# Patient Record
Sex: Female | Born: 1940 | Race: Black or African American | Hispanic: No | Marital: Single | Smoking: Former smoker
Health system: Southern US, Community
[De-identification: ages and names within clinical notes are randomized; demographics above are authoritative.]

## PROBLEM LIST (undated history)

## (undated) ENCOUNTER — Emergency Department (HOSPITAL_COMMUNITY): Payer: Medicare Other

## (undated) DIAGNOSIS — M1990 Unspecified osteoarthritis, unspecified site: Secondary | ICD-10-CM

## (undated) DIAGNOSIS — M549 Dorsalgia, unspecified: Secondary | ICD-10-CM

## (undated) DIAGNOSIS — I10 Essential (primary) hypertension: Secondary | ICD-10-CM

## (undated) DIAGNOSIS — K219 Gastro-esophageal reflux disease without esophagitis: Secondary | ICD-10-CM

## (undated) DIAGNOSIS — M25569 Pain in unspecified knee: Secondary | ICD-10-CM

## (undated) DIAGNOSIS — F32A Depression, unspecified: Secondary | ICD-10-CM

## (undated) DIAGNOSIS — F329 Major depressive disorder, single episode, unspecified: Secondary | ICD-10-CM

## (undated) HISTORY — DX: Major depressive disorder, single episode, unspecified: F32.9

## (undated) HISTORY — DX: Pain in unspecified knee: M25.569

## (undated) HISTORY — DX: Depression, unspecified: F32.A

## (undated) HISTORY — DX: Dorsalgia, unspecified: M54.9

## (undated) HISTORY — DX: Gastro-esophageal reflux disease without esophagitis: K21.9

---

## 2010-01-19 ENCOUNTER — Encounter: Admission: RE | Admit: 2010-01-19 | Discharge: 2010-01-19 | Payer: Self-pay | Admitting: Family Medicine

## 2012-02-25 ENCOUNTER — Emergency Department (HOSPITAL_COMMUNITY): Payer: Medicare Other

## 2012-02-25 ENCOUNTER — Emergency Department (HOSPITAL_COMMUNITY)
Admission: EM | Admit: 2012-02-25 | Discharge: 2012-02-25 | Disposition: A | Discharge status: Home Health | Payer: Medicare Other | Attending: Emergency Medicine | Admitting: Emergency Medicine

## 2012-02-25 ENCOUNTER — Encounter (HOSPITAL_COMMUNITY): Payer: Self-pay | Admitting: *Deleted

## 2012-02-25 DIAGNOSIS — E119 Type 2 diabetes mellitus without complications: Secondary | ICD-10-CM | POA: Insufficient documentation

## 2012-02-25 DIAGNOSIS — S93609A Unspecified sprain of unspecified foot, initial encounter: Secondary | ICD-10-CM | POA: Insufficient documentation

## 2012-02-25 DIAGNOSIS — S9030XA Contusion of unspecified foot, initial encounter: Secondary | ICD-10-CM | POA: Insufficient documentation

## 2012-02-25 DIAGNOSIS — M79609 Pain in unspecified limb: Secondary | ICD-10-CM | POA: Insufficient documentation

## 2012-02-25 DIAGNOSIS — W108XXA Fall (on) (from) other stairs and steps, initial encounter: Secondary | ICD-10-CM | POA: Insufficient documentation

## 2012-02-25 DIAGNOSIS — I10 Essential (primary) hypertension: Secondary | ICD-10-CM | POA: Insufficient documentation

## 2012-02-25 HISTORY — DX: Essential (primary) hypertension: I10

## 2012-02-25 HISTORY — DX: Unspecified osteoarthritis, unspecified site: M19.90

## 2012-02-25 MED ORDER — HYDROCODONE-ACETAMINOPHEN 5-325 MG PO TABS: 1.0000 | ORAL_TABLET | Freq: Four times a day (QID) | ORAL | Status: AC | PRN

## 2012-02-25 NOTE — ED Provider Notes (Signed)
History     CSN: [id number]  Arrival date & time 02/25/12  1936   First MD Initiated Contact with Patient 02/25/12 2039      Chief Complaint  Patient presents with  . Foot Pain    (Consider location/radiation/quality/duration/timing/severity/associated sxs/prior treatment) HPI Patient presents the emergency department after falling down 2 stairs, landing with her foot, behind her.  Patient complains of foot pain around the great toe.  Patient states that her ankle is nontender at this time.  Patient denies weakness or numbness in her foot.  States she can move her toes, but there is pain with movement of the great toe.  Patient denies chest pain, syncope, dizziness, prior to the fall.  She states, that she tripped on a stair. Past Medical History  Diagnosis Date  . Hypertension   . Diabetes mellitus   . Arthritis     History reviewed. No pertinent past surgical history.  No family history on file.  History  Substance Use Topics  . Smoking status: Never Smoker   . Smokeless tobacco: Not on file  . Alcohol Use: Yes    OB History    Grav Para Term Preterm Abortions TAB SAB Ect Mult Living                  Review of Systems All other systems negative except as documented in the HPI. All pertinent positives and negatives as reviewed in the HPI.  Allergies  Review of patient's allergies indicates not on file.  Home Medications  No current outpatient prescriptions on file.  BP 156/66  Pulse 74  Temp(Src) 98.4 F (36.9 C) (Oral)  Resp 22  Ht 5\' 5"  (1.651 m)  Wt 210 lb (95.255 kg)  BMI 34.95 kg/m2  SpO2 96%  Physical Exam Physical Examination: General appearance - alert, well appearing, and in no distress, oriented to person, place, and time and overweight Chest - clear to auscultation, no wheezes, rales or rhonchi, symmetric air entry Heart - normal rate, regular rhythm, normal S1, S2, no murmurs, rubs, clicks or gallops Musculoskeletal - patient has bruising  and tenderness at the base of the right great toe with swelling noted in the area.  Palpation of ankle does not elicit any pain, and has full range of motion of the ankle.  Her some mild swelling noted to the lateral ankle, but no palpable pain in that area, the Achilles tendon is grossly intact.  Patient has normal sensation and cap refill is less than 2 seconds  ED Course  Procedures (including critical care time)  Patient will be placed in a postop shoe and referred to orthopedics for followup.  She is told to ice and elevate the foot and toe.  Return here for any worsening in her condition.  Patient was given the results.  Her x-rays at this point there is no fracture seen, but she could not fracture that is not detected on x-ray.  Based on the fact she has some chronic spurring and degeneration at the area where she is hurting.   MDM  MDM Reviewed: nursing note and vitals Interpretation: x-ray            Carlyle Dolly, PA-C 02/25/12 2232

## 2012-02-25 NOTE — Discharge Instructions (Signed)
Follow up with the orthopedist for a recheck. Ice and elevate your foot. Return here as needed.

## 2012-02-25 NOTE — ED Notes (Signed)
Pt states she fell down her step today and twisted her ankle. Pt states she is having right foot pain. Pt states pain increases when she bears weight on foot. No other c/o

## 2012-02-25 NOTE — ED Provider Notes (Signed)
Medical screening examination/treatment/procedure(s) were conducted as a shared visit with non-physician practitioner(s) and myself.  I personally evaluated the patient during the encounter On my exam the patient was in no distress.  Given her pain in the toe and given the equivocal x-ray findings, with her clinical history she was placed in a postoperative shoe and will followup with orthopedics  Gerhard Munch, MD 02/25/12 2332

## 2012-06-25 ENCOUNTER — Encounter (INDEPENDENT_AMBULATORY_CARE_PROVIDER_SITE_OTHER): Payer: Self-pay

## 2013-05-18 IMAGING — CR DG FOOT COMPLETE 3+V*R*
3 series · 3 of 3 positions shown · non-contrast
Comparison: None.

CLINICAL DATA: Fall.  Twisting foot injury.  Medial pain.

RIGHT FOOT COMPLETE - 3+ VIEW

[x foot ap right]
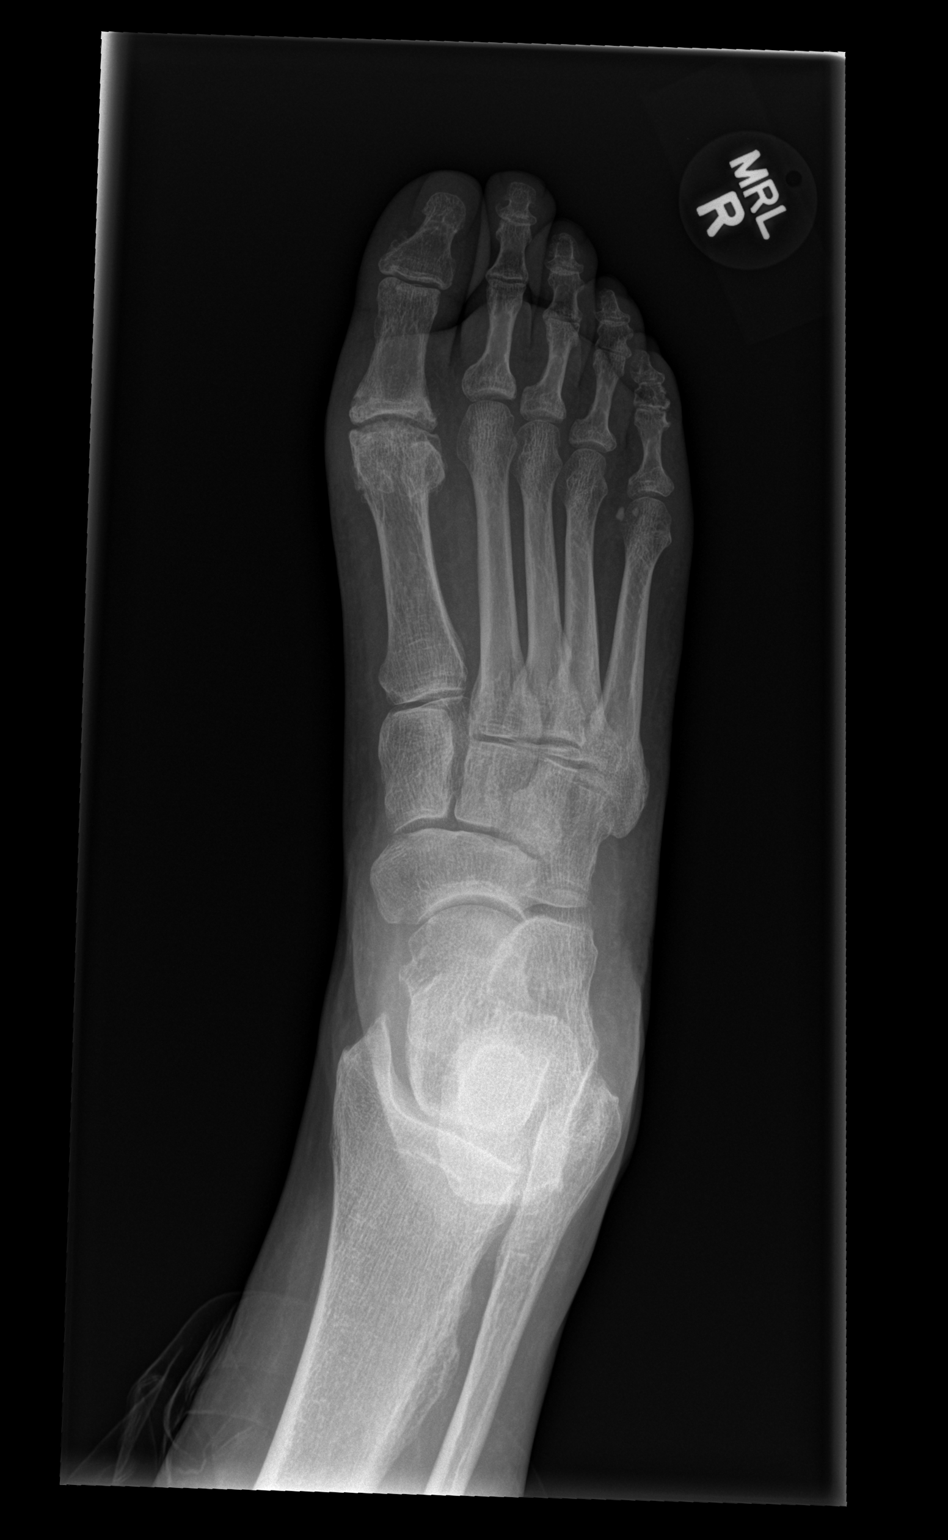

[x foot obl right]
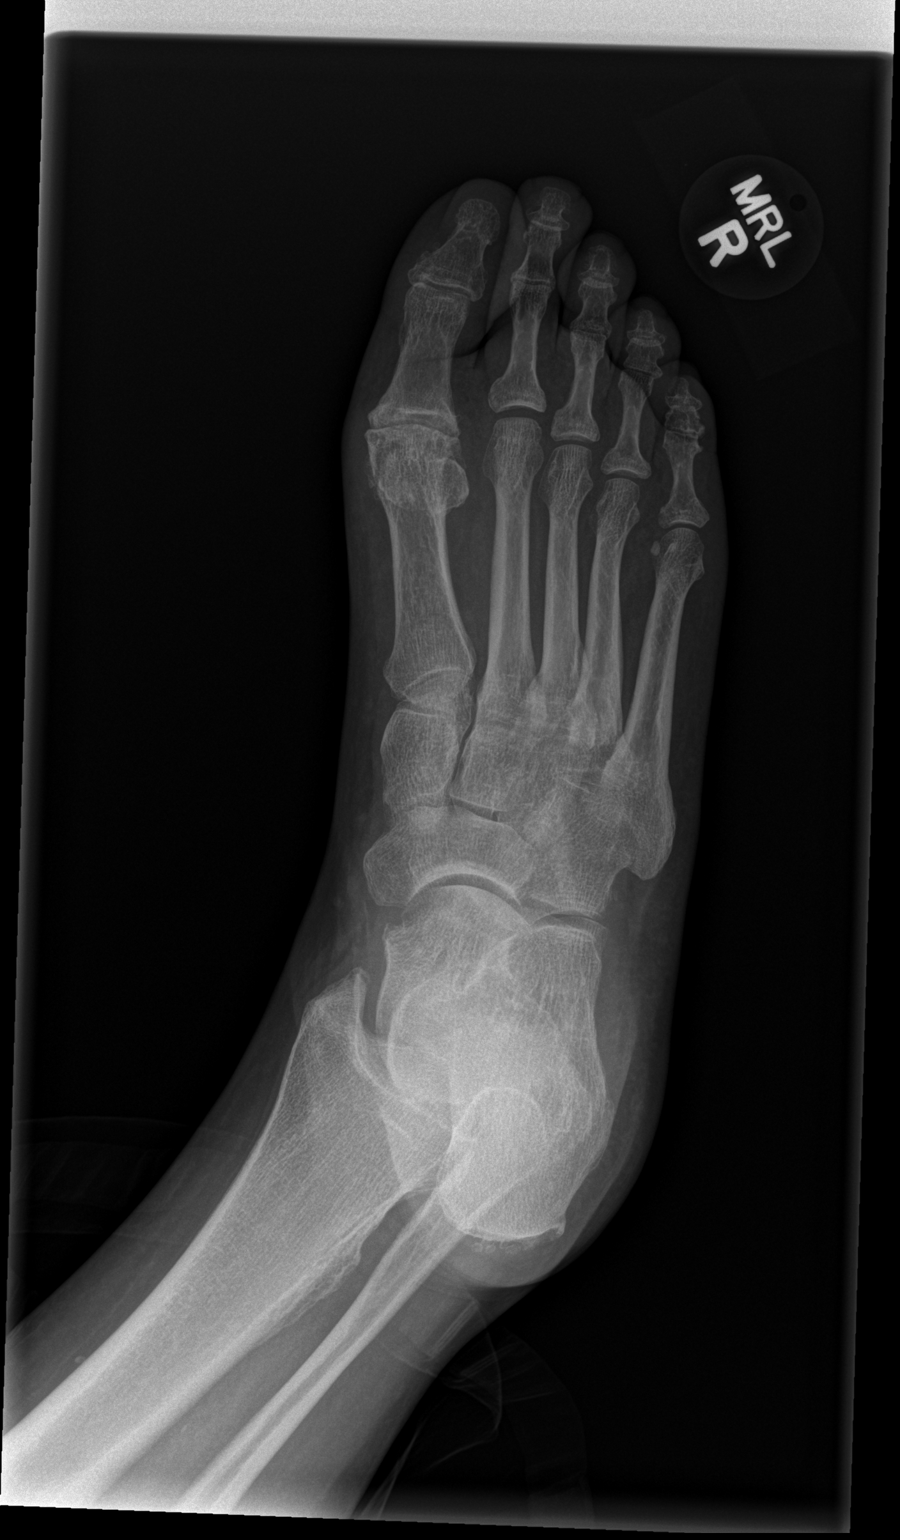

[x foot lat right]
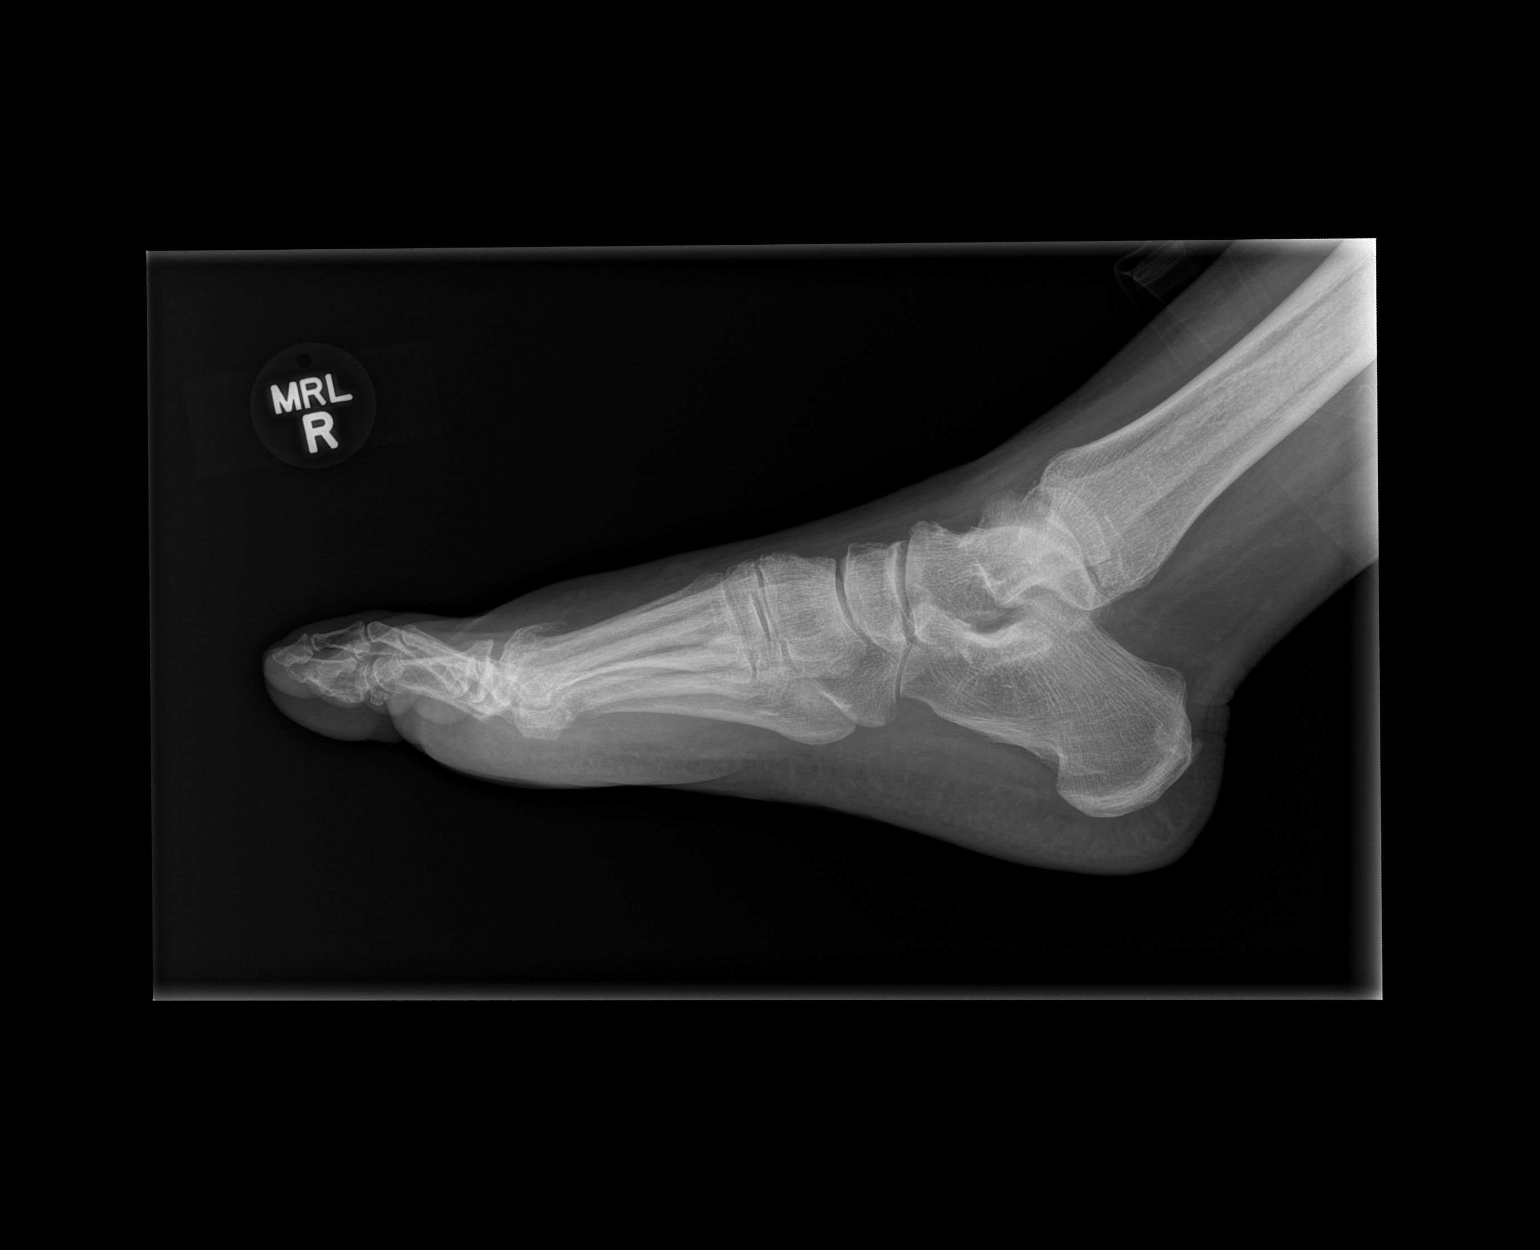

[3 of 3 positions shown; findings below may reference images not displayed]

FINDINGS: Prominent irregular spurring at the first
metatarsophalangeal joint noted.  Bony irregularity in this
vicinity favors spurring, although a fractured spur is difficult to
confidently exclude given that this is the location of the
patient's pain.  A well-defined fracture is not observed.

Alignment at the Lisfranc joint appears normal.  Medial sesamoid is
mildly indistinct due to the spurring which projects in this
vicinity.

There is a suggestion of mild callus formation in the proximal
metaphysis of the third metatarsal, possibly from old injury.

Achilles calcaneal spur noted.  There is mild dorsal midfoot
spurring.
IMPRESSION: 1.  Prominent spurring of the first metatarsal head and base of the
proximal phalanx of the great toe.  The degree of spurring makes it
difficult to exclude the possibility of underlying subtle fracture.
If symptoms persist despite conservative therapy, MRI followup may
be warranted.
2.  Mild cortical thickening laterally in the proximal metaphysis
of the third metatarsal, potentially from an old injury.
3.  Dorsal midfoot spurring.

## 2015-08-09 ENCOUNTER — Other Ambulatory Visit: Payer: Self-pay

## 2015-08-16 ENCOUNTER — Inpatient Hospital Stay: Payer: Self-pay | Admitting: Oncology

## 2015-12-28 ENCOUNTER — Other Ambulatory Visit: Payer: Self-pay | Admitting: Gastroenterology

## 2015-12-28 DIAGNOSIS — R1310 Dysphagia, unspecified: Secondary | ICD-10-CM

## 2016-03-28 ENCOUNTER — Ambulatory Visit: Payer: Self-pay | Admitting: Family Medicine

## 2018-06-04 ENCOUNTER — Encounter (INDEPENDENT_AMBULATORY_CARE_PROVIDER_SITE_OTHER): Payer: Self-pay

## 2018-06-10 ENCOUNTER — Encounter (INDEPENDENT_AMBULATORY_CARE_PROVIDER_SITE_OTHER): Payer: Self-pay | Admitting: Family Medicine

## 2018-06-10 ENCOUNTER — Ambulatory Visit (INDEPENDENT_AMBULATORY_CARE_PROVIDER_SITE_OTHER): Payer: Medicare Other | Admitting: Family Medicine

## 2018-06-10 VITALS — BP 150/80 | HR 59 | Temp 98.0°F | Ht 65.0 in | Wt 209.0 lb

## 2018-06-10 DIAGNOSIS — I10 Essential (primary) hypertension: Secondary | ICD-10-CM | POA: Diagnosis not present

## 2018-06-10 DIAGNOSIS — R0602 Shortness of breath: Secondary | ICD-10-CM

## 2018-06-10 DIAGNOSIS — Z6834 Body mass index (BMI) 34.0-34.9, adult: Secondary | ICD-10-CM | POA: Diagnosis not present

## 2018-06-10 DIAGNOSIS — E669 Obesity, unspecified: Secondary | ICD-10-CM

## 2018-06-10 DIAGNOSIS — E038 Other specified hypothyroidism: Secondary | ICD-10-CM | POA: Diagnosis not present

## 2018-06-10 DIAGNOSIS — Z0289 Encounter for other administrative examinations: Secondary | ICD-10-CM

## 2018-06-10 DIAGNOSIS — R5383 Other fatigue: Secondary | ICD-10-CM | POA: Diagnosis not present

## 2018-06-10 DIAGNOSIS — Z1331 Encounter for screening for depression: Secondary | ICD-10-CM | POA: Diagnosis not present

## 2018-06-10 DIAGNOSIS — E119 Type 2 diabetes mellitus without complications: Secondary | ICD-10-CM

## 2018-06-11 LAB — COMPREHENSIVE METABOLIC PANEL
ALBUMIN: 4.1 g/dL (ref 3.5–4.8)
ALK PHOS: 80 IU/L (ref 39–117)
ALT: 14 IU/L (ref 0–32)
AST: 14 IU/L (ref 0–40)
Albumin/Globulin Ratio: 1.7 (ref 1.2–2.2)
BILIRUBIN TOTAL: 0.3 mg/dL (ref 0.0–1.2)
BUN / CREAT RATIO: 15 (ref 12–28)
BUN: 13 mg/dL (ref 8–27)
CHLORIDE: 101 mmol/L (ref 96–106)
CO2: 25 mmol/L (ref 20–29)
Calcium: 9.2 mg/dL (ref 8.7–10.3)
Creatinine, Ser: 0.84 mg/dL (ref 0.57–1.00)
GFR calc non Af Amer: 68 mL/min/{1.73_m2} (ref >59)
GFR, EST AFRICAN AMERICAN: 79 mL/min/{1.73_m2} (ref >59)
GLOBULIN, TOTAL: 2.4 g/dL (ref 1.5–4.5)
Glucose: 92 mg/dL (ref 65–99)
Potassium: 4.4 mmol/L (ref 3.5–5.2)
Sodium: 141 mmol/L (ref 134–144)
Total Protein: 6.5 g/dL (ref 6.0–8.5)

## 2018-06-11 LAB — CBC WITH DIFFERENTIAL
Basophils Absolute: 0 10*3/uL (ref 0.0–0.2)
Basos: 0 %
EOS (ABSOLUTE): 0.1 10*3/uL (ref 0.0–0.4)
EOS: 3 %
HEMATOCRIT: 41.9 % (ref 34.0–46.6)
HEMOGLOBIN: 13.2 g/dL (ref 11.1–15.9)
Immature Grans (Abs): 0 10*3/uL (ref 0.0–0.1)
Immature Granulocytes: 0 %
Lymphocytes Absolute: 2.5 10*3/uL (ref 0.7–3.1)
Lymphs: 47 %
MCH: 30.9 pg (ref 26.6–33.0)
MCHC: 31.5 g/dL (ref 31.5–35.7)
MCV: 98 fL — AB (ref 79–97)
MONOS ABS: 0.4 10*3/uL (ref 0.1–0.9)
Monocytes: 8 %
Neutrophils Absolute: 2.2 10*3/uL (ref 1.4–7.0)
Neutrophils: 42 %
RBC: 4.27 x10E6/uL (ref 3.77–5.28)
RDW: 13.3 % (ref 12.3–15.4)
WBC: 5.2 10*3/uL (ref 3.4–10.8)

## 2018-06-11 LAB — TSH: TSH: 2.94 u[IU]/mL (ref 0.450–4.500)

## 2018-06-11 LAB — T4, FREE: FREE T4: 1.25 ng/dL (ref 0.82–1.77)

## 2018-06-11 LAB — T3: T3 TOTAL: 107 ng/dL (ref 71–180)

## 2018-06-11 LAB — VITAMIN D 25 HYDROXY (VIT D DEFICIENCY, FRACTURES): Vit D, 25-Hydroxy: 75.7 ng/mL (ref 30.0–100.0)

## 2018-06-11 LAB — LIPID PANEL WITH LDL/HDL RATIO
Cholesterol, Total: 164 mg/dL (ref 100–199)
HDL: 70 mg/dL (ref >39)
LDL CALC: 76 mg/dL (ref 0–99)
LDl/HDL Ratio: 1.1 ratio (ref 0.0–3.2)
Triglycerides: 89 mg/dL (ref 0–149)
VLDL CHOLESTEROL CAL: 18 mg/dL (ref 5–40)

## 2018-06-11 LAB — INSULIN, RANDOM: INSULIN: 1.7 u[IU]/mL — ABNORMAL LOW (ref 2.6–24.9)

## 2018-06-11 NOTE — Progress Notes (Signed)
.  Office: [phone]  /  Fax: [phone]   HPI:   Chief Complaint: OBESITY  Jill Graham (MR# [id number]) is a 77 y.o. female who presents on 06/11/2018 for obesity evaluation and treatment. Current BMI is Body mass index is 34.78 kg/m.Marland Kitchen Jill Graham has struggled with obesity for years and has been unsuccessful in either losing weight or maintaining long term weight loss. Alishba heard about our clinic from a friend. Jill Graham attended our information session and states she is currently in the action stage of change and ready to dedicate time achieving and maintaining a healthier weight.  Jill Graham states her family eats meals together her desired weight loss is 40 lbs she has been heavy most of  her life she started gaining weight after college her heaviest weight ever was 220 lbs. she has significant food cravings issues  she skips meals frequently she is frequently drinking liquids with calories she frequently makes poor food choices she has problems with excessive hunger  she frequently eats larger portions than normal  she has binge eating behaviors she struggles with emotional eating    Jill Graham feels her energy is lower than it should be. This has worsened with weight gain and has not worsened recently. Jill Graham admits to daytime somnolence and admits to waking up still tired. Patient is at risk for obstructive sleep apnea. Patent has a history of symptoms of daytime Jill, morning Jill, morning headache and hypertension. Patient generally gets 5 or 6 hours of sleep per night, and states they generally have restless sleep. Snoring is present. Apneic episodes are not present. Epworth Sleepiness Score is 7  EKG was ordered today which shows normal sinus rhythm with poor R wave progression.  Dyspnea on exertion Ashni notes increasing shortness of breath with exercising and seems to be worsening over time with weight gain. She notes getting out of breath sooner with activity than she used  to. This has not gotten worse recently.  EKG was ordered today which shows normal sinus rhythm with poor R wave progression. Iona denies orthopnea.  Hypertension Jill Graham is a 77 y.o. female with hypertension. Joycelin Linna Darner denies chest pain or shortness of breath on exertion. She is working weight loss to help control her blood pressure with the goal of decreasing her risk of heart attack and stroke. Dianes blood pressure is uncontrolled, although she took her medications. Rainna is on numerous medications. EKG was ordered today and shows normal sinus rhythm with poor R wave progression.   Hypothyroidism Jill Graham has a diagnosis of hypothyroidism. She is on levothyroxine 25 mcg daily and she has had a recent increase in her medications.. She admits to cold intolerance, palpitations and Jill in the past, but not currently.  Diabetes II Jill Graham has a diagnosis of diabetes type II. Jill Graham is on metformin currently and she admits to polyphagia in the past but not currently. She denies any hypoglycemic episodes. Last A1c was at 5.7 She is attempting to work on intensive lifestyle modifications including diet, exercise, and weight loss to help control her blood glucose levels.  Depression Screen Jill Graham Food and Mood (modified PHQ-9) score was  Depression screen PHQ 2/9 06/10/2018  Decreased Interest 1  Down, Depressed, Hopeless 1  PHQ - 2 Score 2  Altered sleeping 2  Tired, decreased energy 3  Change in appetite 0  Feeling bad or failure about yourself  1  Trouble concentrating 0  Moving slowly or fidgety/restless 1  Suicidal thoughts 0  PHQ-9  Score 9  Difficult doing work/chores Somewhat difficult    ALLERGIES: No Known Allergies  MEDICATIONS: Current Outpatient Medications on File Prior to Visit  Medication Sig Dispense Refill  . acyclovir (ZOVIRAX) 400 MG tablet Take 400 mg by mouth every 12 (twelve) hours.    Marland Kitchen aspirin EC 81 MG tablet Take 81 mg by mouth daily.    .  bisoprolol-hydrochlorothiazide (ZIAC) 5-6.25 MG per tablet Take 1 tablet by mouth daily.    . cholecalciferol (VITAMIN D) 1000 units tablet Take 1,000 Units by mouth daily.    Marland Kitchen FLUoxetine (PROZAC) 20 MG capsule Take 20 mg by mouth daily.    Marland Kitchen FLUoxetine (PROZAC) 40 MG capsule Take 40 mg by mouth daily.    Marland Kitchen levothyroxine (SYNTHROID, LEVOTHROID) 25 MCG tablet Take 25 mcg by mouth daily.    Marland Kitchen losartan (COZAAR) 100 MG tablet Take 100 mg by mouth daily.    . metFORMIN (GLUCOPHAGE) 500 MG tablet Take by mouth 2 (two) times daily with a meal.    . Multiple Vitamins-Minerals (MULTIVITAMIN WITH MINERALS) tablet Take 1 tablet by mouth daily.    . Omega-3 Fatty Acids (FISH OIL) 1000 MG CAPS Take by mouth.    Marland Kitchen omeprazole (PRILOSEC) 40 MG capsule Take 40 mg by mouth daily.    . simvastatin (ZOCOR) 20 MG tablet Take 20 mg by mouth every evening.    . traZODone (DESYREL) 50 MG tablet Take 50 mg by mouth at bedtime.    . vitamin B-12 (CYANOCOBALAMIN) 1000 MCG tablet Take 1,000 mcg by mouth daily.     No current facility-administered medications on file prior to visit.     PAST MEDICAL HISTORY: Past Medical History:  Diagnosis Date  . Arthritis   . Arthritis   . Back pain   . Diabetes mellitus   . Hypertension   . Knee pain     PAST SURGICAL HISTORY: Past Surgical History:  Procedure Laterality Date  . CESAREAN SECTION  1975    SOCIAL HISTORY: Social History   Tobacco Use  . Smoking status: Former Games developer  . Smokeless tobacco: Never Used  Substance Use Topics  . Alcohol use: Yes  . Drug use: No    FAMILY HISTORY: Family History  Problem Relation Age of Onset  . High blood pressure Mother   . High Cholesterol Mother   . Stroke Mother   . Alcoholism Mother   . Obesity Mother   . Alcoholism Father   . Diabetes Father     ROS: Review of Systems  Constitutional: Positive for malaise/Jill.  HENT: Positive for congestion (nasal stuffiness) and ear discharge.   Eyes: Positive  for blurred vision.       Positive for Vision Changes + Wear Glasses or Contacts   Respiratory: Positive for cough and shortness of breath (with activity).   Cardiovascular: Positive for palpitations. Negative for orthopnea.       Positive for Calf/Leg Pain with Walking   Genitourinary: Positive for frequency.  Neurological: Positive for headaches.  Endo/Heme/Allergies:       Positive for heat or cold intolerance Positive for polyphagia Negative for hypoglycemia  Psychiatric/Behavioral: Positive for depression. The patient has insomnia.     PHYSICAL EXAM: Blood pressure (!) 150/80, pulse (!) 59, temperature 98 F (36.7 C), temperature source Oral, height 5\' 5"  (1.651 m), weight 209 lb (94.8 kg), SpO2 96 %. Body mass index is 34.78 kg/m. Physical Exam  Constitutional: She is oriented to person, place, and time. She appears  well-developed and well-nourished.  HENT:  Head: Normocephalic and atraumatic.  Nose: Nose normal.  Eyes: EOM are normal. No scleral icterus.  Neck: Normal range of motion. Neck supple. No thyromegaly present.  Cardiovascular: Regular rhythm. Bradycardia present.  Pulmonary/Chest: Effort normal. No respiratory distress.  Abdominal: Soft. There is no tenderness.  + obesity  Musculoskeletal: Normal range of motion.  Range of motion normal in all 4 extremities  Neurological: She is alert and oriented to person, place, and time. Coordination normal.  Skin: Skin is warm and dry.  Psychiatric: She has a normal mood and affect. Her behavior is normal.  Vitals reviewed.   RECENT LABS AND TESTS: BMET No results found for: NA, K, CL, CO2, GLUCOSE, BUN, CREATININE, CALCIUM, GFRNONAA, GFRAA No results found for: HGBA1C No results found for: INSULIN CBC No results found for: WBC, RBC, HGB, HCT, PLT, MCV, MCH, MCHC, RDW, LYMPHSABS, MONOABS, EOSABS, BASOSABS Iron/TIBC/Ferritin/ %Sat No results found for: IRON, TIBC, FERRITIN, IRONPCTSAT Lipid Panel  No results  found for: CHOL, TRIG, HDL, CHOLHDL, VLDL, LDLCALC, LDLDIRECT Hepatic Function Panel  No results found for: PROT, ALBUMIN, AST, ALT, ALKPHOS, BILITOT, BILIDIR, IBILI No results found for: TSH Vitamin D There are no recent labs results  ECG  shows NSR with a rate of 59 BPM INDIRECT CALORIMETER done today shows a VO2 of 171 and a REE of 1190. Her calculated basal metabolic rate is 1191 thus her basal metabolic rate is worse than expected.    ASSESSMENT AND PLAN: Other Jill - Plan: EKG 12-Lead, CBC With Differential, VITAMIN D 25 Hydroxy (Vit-D Deficiency, Fractures)  Shortness of breath on exertion - Plan: CBC With Differential  Essential hypertension - Plan: Lipid Panel With LDL/HDL Ratio  Other specified hypothyroidism - Plan: T3, T4, free, TSH  Type 2 diabetes mellitus without complication, without long-term current use of insulin (HCC) - Plan: Comprehensive metabolic panel, Insulin, random  Depression screening  Class 1 obesity with serious comorbidity and body mass index (BMI) of 34.0 to 34.9 in adult, unspecified obesity type  PLAN:  Jill Jill Graham was informed that her Jill may be related to obesity, depression or many other causes. Labs will be ordered, and in the meanwhile Edye has agreed to work on diet, exercise and weight loss to help with Jill. Proper sleep hygiene was discussed including the need for 7-8 hours of quality sleep each night. A sleep study was not ordered based on symptoms and Epworth score. We will order EKG and indirect calorimetry today.  Dyspnea on exertion Jill Graham's shortness of breath appears to be obesity related and exercise induced. She has agreed to work on weight loss and gradually increase exercise to treat her exercise induced shortness of breath. If Seletha follows our instructions and loses weight without improvement of her shortness of breath, we will plan to refer to pulmonology. We will order labs, EKG and indirect calorimetry today  and we will monitor this condition regularly. Shae agrees to this plan.  Hypertension We discussed sodium restriction, working on healthy weight loss, and a regular exercise program as the means to achieve improved blood pressure control. Jill Graham agreed with this plan and agreed to follow up as directed. We will continue to monitor her blood pressure as well as her progress with the above lifestyle modifications. She will continue her medications as prescribed and will watch for signs of hypotension as she continues her lifestyle modifications. We will order EKG and CMP today.  Hypothyroidism Jill Graham was informed of the importance of  good thyroid control to help with weight loss efforts. She was also informed that supertheraputic thyroid levels are dangerous and will not improve weight loss results. We will check thyroid panel today and Ayaan will follow up as directed.  Diabetes II Jill Graham has been given extensive diabetes education by myself today including ideal fasting and post-prandial blood glucose readings, individual ideal Hgb A1c goals and hypoglycemia prevention. We discussed the importance of good blood sugar control to decrease the likelihood of diabetic complications such as nephropathy, neuropathy, limb loss, blindness, coronary artery disease, and death. We discussed the importance of intensive lifestyle modification including diet, exercise and weight loss as the first line treatment for diabetes. We will check insulin level today. Terrilynn agrees to continue her diabetes medications and will follow up at the agreed upon time.  Depression Screen Jill Graham had a mildly positive depression screening. Depression is commonly associated with obesity and often results in emotional eating behaviors. We will monitor this closely and work on CBT to help improve the non-hunger eating patterns. Referral to Psychology may be required if no improvement is seen as she continues in our clinic.  Obesity Jill Graham is  currently in the action stage of change and her goal is to continue with weight loss efforts She has agreed to follow the Category 1 plan Jill Graham has been instructed to work up to a goal of 150 minutes of combined cardio and strengthening exercise per week for weight loss and overall health benefits. We discussed the following Behavioral Modification Strategies today: planning for success increasing lean protein intake, increasing vegetables and work on meal planning and easy cooking plans  Jill Graham has agreed to follow up with our clinic in 2 weeks. She was informed of the importance of frequent follow up visits to maximize her success with intensive lifestyle modifications for her multiple health conditions. She was informed we would discuss her lab results at her next visit unless there is a critical issue that needs to be addressed sooner. Jill Graham agreed to keep her next visit at the agreed upon time to discuss these results.    OBESITY BEHAVIORAL INTERVENTION VISIT  Today's visit was # 1   Starting weight: 209 lbs Starting date: 06/10/18 Today's weight : 209 lbs Today's date: 06/10/2018 Total lbs lost to date: 0 At least 15 minutes were spent on discussing the following behavioral intervention visit.   ASK: We discussed the diagnosis of obesity with Jill Graham today and Tymeshia agreed to give us permission to discuss obesity behavioral modification therapy today.  ASSESS: Kjirsten has the diagnosis of obesity and her BMI today is 34.78 Belva is in the action stage of change   ADVISE: Emili was educated on the multiple health risks of obesity as well as the benefit of weight loss to improve her health. She was advised of the need for long term treatment and the importance of lifestyle modifications to improve her current health and to decrease her risk of future health problems.  AGREE: Multiple dietary modification options and treatment options were discussed and  Joscelynn agreed to follow the  recommendations documented in the above note.  ARRANGE: Matayah was educated on the importance of frequent visits to treat obesity as outlined per CMS and USPSTF guidelines and agreed to schedule her next follow up appointment today.   I, [place] CraneJoanne Murray, am acting as transcriptionist for Filbert SchilderAlexandria U. Kadolph, MD    I have reviewed the above documentation for accuracy and completeness, and I agree with the  above. - Debbra Riding, MD

## 2018-06-24 ENCOUNTER — Ambulatory Visit (INDEPENDENT_AMBULATORY_CARE_PROVIDER_SITE_OTHER): Payer: Medicare Other | Admitting: Family Medicine

## 2018-06-24 VITALS — BP 122/69 | HR 62 | Temp 98.3°F | Ht 65.0 in | Wt 205.0 lb

## 2018-06-24 DIAGNOSIS — E119 Type 2 diabetes mellitus without complications: Secondary | ICD-10-CM | POA: Diagnosis not present

## 2018-06-24 DIAGNOSIS — Z6834 Body mass index (BMI) 34.0-34.9, adult: Secondary | ICD-10-CM

## 2018-06-24 DIAGNOSIS — E669 Obesity, unspecified: Secondary | ICD-10-CM | POA: Diagnosis not present

## 2018-06-24 DIAGNOSIS — E038 Other specified hypothyroidism: Secondary | ICD-10-CM | POA: Diagnosis not present

## 2018-06-29 NOTE — Progress Notes (Signed)
Office: [phone]  /  Fax: [phone]   HPI:   Chief Complaint: OBESITY Jill Graham is here to discuss her progress with her obesity treatment plan. She is on the Category 1 plan and is following her eating plan approximately 70 % of the time. She states she is riding stationary bike for 30 minutes 7 times per week. Jill Graham tripped and fell and broke her toe and sprained her knee 3 days ago. She just returned from [place] and she notes hunger is controlled.  Her weight is 205 lb (93 kg) today and has had a weight loss of 4 pounds over a period of 2 weeks since her last visit. She has lost 4 lbs since starting treatment with [name].  Diabetes II Jill Graham has a diagnosis of diabetes type II. [name]'s Hgb A1c was previously 5.7 prior to her first appointment and insulin was low at 1.7. She denies any hypoglycemic episodes. She has been working on intensive lifestyle modifications including diet, exercise, and weight loss to help control her blood glucose levels.  Hypothyroidism Media has a diagnosis of hypothyroidism. She is on levothyroxine and thyroid panel is within normal limits. She denies hot or cold intolerance or palpitations.  ALLERGIES: No Known Allergies  MEDICATIONS: Current Outpatient Medications on File Prior to Visit  Medication Sig Dispense Refill  . acyclovir (ZOVIRAX) 400 MG tablet Take 400 mg by mouth every 12 (twelve) hours.    Marland Kitchen aspirin EC 81 MG tablet Take 81 mg by mouth daily.    . bisoprolol-hydrochlorothiazide (ZIAC) 5-6.25 MG per tablet Take 1 tablet by mouth daily.    . cholecalciferol (VITAMIN D) 1000 units tablet Take 1,000 Units by mouth daily.    Marland Kitchen FLUoxetine (PROZAC) 20 MG capsule Take 20 mg by mouth daily.    Marland Kitchen FLUoxetine (PROZAC) 40 MG capsule Take 40 mg by mouth daily.    Marland Kitchen levothyroxine (SYNTHROID, LEVOTHROID) 25 MCG tablet Take 25 mcg by mouth daily.    Marland Kitchen losartan (COZAAR) 100 MG tablet Take 100 mg by mouth daily.    . metFORMIN (GLUCOPHAGE) 500 MG tablet  Take by mouth 2 (two) times daily with a meal.    . Multiple Vitamins-Minerals (MULTIVITAMIN WITH MINERALS) tablet Take 1 tablet by mouth daily.    . Omega-3 Fatty Acids (FISH OIL) 1000 MG CAPS Take by mouth.    Marland Kitchen omeprazole (PRILOSEC) 40 MG capsule Take 40 mg by mouth daily.    . simvastatin (ZOCOR) 20 MG tablet Take 20 mg by mouth every evening.    . traZODone (DESYREL) 50 MG tablet Take 50 mg by mouth at bedtime.    . vitamin B-12 (CYANOCOBALAMIN) 1000 MCG tablet Take 1,000 mcg by mouth daily.     No current facility-administered medications on file prior to visit.     PAST MEDICAL HISTORY: Past Medical History:  Diagnosis Date  . Arthritis   . Arthritis   . Back pain   . Diabetes mellitus   . Hypertension   . Knee pain     PAST SURGICAL HISTORY: Past Surgical History:  Procedure Laterality Date  . CESAREAN SECTION  1975    SOCIAL HISTORY: Social History   Tobacco Use  . Smoking status: Former Games developer  . Smokeless tobacco: Never Used  Substance Use Topics  . Alcohol use: Yes  . Drug use: No    FAMILY HISTORY: Family History  Problem Relation Age of Onset  . High blood pressure Mother   . High Cholesterol Mother   .  Stroke Mother   . Alcoholism Mother   . Obesity Mother   . Alcoholism Father   . Diabetes Father     ROS: Review of Systems  Constitutional: Positive for weight loss.  Cardiovascular: Negative for palpitations.  Endo/Heme/Allergies:       Negative hypoglycemia Negative hot/cold intolerance    PHYSICAL EXAM: Blood pressure 122/69, pulse 62, temperature 98.3 F (36.8 C), temperature source Oral, height 5\' 5"  (1.651 m), weight 205 lb (93 kg), SpO2 94 %. Body mass index is 34.11 kg/m. Physical Exam  Constitutional: She is oriented to person, place, and time. She appears well-developed and well-nourished.  Cardiovascular: Normal rate.  Pulmonary/Chest: Effort normal.  Musculoskeletal: Normal range of motion.  Neurological: She is oriented  to person, place, and time.  Skin: Skin is warm and dry.  Psychiatric: She has a normal mood and affect. Her behavior is normal.  Vitals reviewed.   RECENT LABS AND TESTS: BMET    Component Value Date/Time   NA 141 06/10/2018 1215   K 4.4 06/10/2018 1215   CL 101 06/10/2018 1215   CO2 25 06/10/2018 1215   GLUCOSE 92 06/10/2018 1215   BUN 13 06/10/2018 1215   CREATININE 0.84 06/10/2018 1215   CALCIUM 9.2 06/10/2018 1215   GFRNONAA 68 06/10/2018 1215   GFRAA 79 06/10/2018 1215   No results found for: HGBA1C Lab Results  Component Value Date   INSULIN 1.7 (L) 06/10/2018   CBC    Component Value Date/Time   WBC 5.2 06/10/2018 1215   RBC 4.27 06/10/2018 1215   HGB 13.2 06/10/2018 1215   HCT 41.9 06/10/2018 1215   MCV 98 (H) 06/10/2018 1215   MCH 30.9 06/10/2018 1215   MCHC 31.5 06/10/2018 1215   RDW 13.3 06/10/2018 1215   LYMPHSABS 2.5 06/10/2018 1215   EOSABS 0.1 06/10/2018 1215   BASOSABS 0.0 06/10/2018 1215   Iron/TIBC/Ferritin/ %Sat No results found for: IRON, TIBC, FERRITIN, IRONPCTSAT Lipid Panel     Component Value Date/Time   CHOL 164 06/10/2018 1215   TRIG 89 06/10/2018 1215   HDL 70 06/10/2018 1215   LDLCALC 76 06/10/2018 1215   Hepatic Function Panel     Component Value Date/Time   PROT 6.5 06/10/2018 1215   ALBUMIN 4.1 06/10/2018 1215   AST 14 06/10/2018 1215   ALT 14 06/10/2018 1215   ALKPHOS 80 06/10/2018 1215   BILITOT 0.3 06/10/2018 1215      Component Value Date/Time   TSH 2.940 06/10/2018 1215    ASSESSMENT AND PLAN: Type 2 diabetes mellitus without complication, without long-term current use of insulin (HCC)  Other specified hypothyroidism  Class 1 obesity with serious comorbidity and body mass index (BMI) of 34.0 to 34.9 in adult, unspecified obesity type  PLAN:  Diabetes II Navi has been given extensive diabetes education by myself today including ideal fasting and post-prandial blood glucose readings, individual ideal Hgb  A1c goals and hypoglycemia prevention. We discussed the importance of good blood sugar control to decrease the likelihood of diabetic complications such as nephropathy, neuropathy, limb loss, blindness, coronary artery disease, and death. We discussed the importance of intensive lifestyle modification including diet, exercise and weight loss as the first line treatment for diabetes. Aniela agrees to continue taking metformin and she agrees to follow up with our clinic in 2 weeks.  Hypothyroidism Cataleah was informed of the importance of good thyroid control to help with weight loss efforts. She was also informed that supertheraputic thyroid levels  are dangerous and will not improve weight loss results. Izetta agrees to continue taking levothyroxine with no change in dose, and she agrees to follow up with our clinic in 2 weeks.  I spent > than 50% of the 15 minute visit on counseling as documented in the note.  Obesity Everlean is currently in the action stage of change. As such, her goal is to continue with weight loss efforts She has agreed to follow the Category 1 plan Rukia has been instructed to work up to a goal of 150 minutes of combined cardio and strengthening exercise per week for weight loss and overall health benefits. We discussed the following Behavioral Modification Strategies today: increasing lean protein intake, increasing vegetables, work on meal planning and easy cooking plans, and planning for success   Tameca has agreed to follow up with our clinic in 2 weeks. She was informed of the importance of frequent follow up visits to maximize her success with intensive lifestyle modifications for her multiple health conditions.   OBESITY BEHAVIORAL INTERVENTION VISIT  Today's visit was # 2   Starting weight: 209 lbs Starting date: 06/10/18 Today's weight : 205 lbs  Today's date: 06/24/2018 Total lbs lost to date: 4    ASK: We discussed the diagnosis of obesity with Scot Juniane E Massaro  today and Elayjah agreed to give us permission to discuss obesity behavioral modification therapy today.  ASSESS: Ayde has the diagnosis of obesity and her BMI today is 34.11 Ceniyah is in the action stage of change   ADVISE: Roniyah was educated on the multiple health risks of obesity as well as the benefit of weight loss to improve her health. She was advised of the need for long term treatment and the importance of lifestyle modifications to improve her current health and to decrease her risk of future health problems.  AGREE: Multiple dietary modification options and treatment options were discussed and  Shetara agreed to follow the recommendations documented in the above note.  ARRANGE: Trystan was educated on the importance of frequent visits to treat obesity as outlined per CMS and USPSTF guidelines and agreed to schedule her next follow up appointment today.  I, Burt KnackSharon Martin, am acting as transcriptionist for Debbra RidingAlexandria Kadolph, MD  I have reviewed the above documentation for accuracy and completeness, and I agree with the above. - Debbra RidingAlexandria Kadolph, MD

## 2018-07-08 ENCOUNTER — Ambulatory Visit (INDEPENDENT_AMBULATORY_CARE_PROVIDER_SITE_OTHER): Payer: Medicare Other | Admitting: Family Medicine

## 2018-07-08 VITALS — BP 117/69 | HR 65 | Temp 98.9°F | Ht 65.0 in | Wt 201.0 lb

## 2018-07-08 DIAGNOSIS — Z6833 Body mass index (BMI) 33.0-33.9, adult: Secondary | ICD-10-CM

## 2018-07-08 DIAGNOSIS — E669 Obesity, unspecified: Secondary | ICD-10-CM

## 2018-07-08 DIAGNOSIS — E119 Type 2 diabetes mellitus without complications: Secondary | ICD-10-CM | POA: Diagnosis not present

## 2018-07-08 DIAGNOSIS — E038 Other specified hypothyroidism: Secondary | ICD-10-CM | POA: Diagnosis not present

## 2018-07-09 NOTE — Progress Notes (Signed)
Office: [phone]  /  Fax: [phone]   HPI:   Chief Complaint: OBESITY Jill Graham is here to discuss her progress with her obesity treatment plan. She is on the Category 1 plan and is following her eating plan approximately 90 % of the time. She states she is riding stationary bike for 15 minutes 3 times per week. Jill Graham is looking for a change, she is bored with dinner especially. She needs to go grocery shopping. She notes hunger.  Her weight is 201 lb (91.2 kg) today and has had a weight loss of 4 pounds over a period of 2 weeks since her last visit. She has lost 8 lbs since starting treatment with [name].  Diabetes II Jill Graham has a diagnosis of diabetes type II. Jill Graham is on angiotensin II receptor blockers, statin, and aspirin. She notes occasional carbohydrate cravings and she denies GI side effects on metformin. She denies any hypoglycemic episodes. She has been working on intensive lifestyle modifications including diet, exercise, and weight loss to help control her blood glucose levels.  Hypothyroidism Jill Graham has a diagnosis of hypothyroidism. She is on levothyroxine. She denies hot or cold intolerance or palpitations.  ALLERGIES: No Known Allergies  MEDICATIONS: Current Outpatient Medications on File Prior to Visit  Medication Sig Dispense Refill  . acyclovir (ZOVIRAX) 400 MG tablet Take 400 mg by mouth every 12 (twelve) hours.    Marland Kitchen aspirin EC 81 MG tablet Take 81 mg by mouth daily.    . bisoprolol-hydrochlorothiazide (ZIAC) 5-6.25 MG per tablet Take 1 tablet by mouth daily.    . cholecalciferol (VITAMIN D) 1000 units tablet Take 1,000 Units by mouth daily.    Marland Kitchen FLUoxetine (PROZAC) 20 MG capsule Take 20 mg by mouth daily.    Marland Kitchen FLUoxetine (PROZAC) 40 MG capsule Take 40 mg by mouth daily.    Marland Kitchen levothyroxine (SYNTHROID, LEVOTHROID) 25 MCG tablet Take 25 mcg by mouth daily.    Marland Kitchen losartan (COZAAR) 100 MG tablet Take 100 mg by mouth daily.    . metFORMIN (GLUCOPHAGE) 500 MG tablet Take  by mouth 2 (two) times daily with a meal.    . Multiple Vitamins-Minerals (MULTIVITAMIN WITH MINERALS) tablet Take 1 tablet by mouth daily.    . Omega-3 Fatty Acids (FISH OIL) 1000 MG CAPS Take by mouth.    Marland Kitchen omeprazole (PRILOSEC) 40 MG capsule Take 40 mg by mouth daily.    . simvastatin (ZOCOR) 20 MG tablet Take 20 mg by mouth every evening.    . traZODone (DESYREL) 50 MG tablet Take 50 mg by mouth at bedtime.    . vitamin B-12 (CYANOCOBALAMIN) 1000 MCG tablet Take 1,000 mcg by mouth daily.     No current facility-administered medications on file prior to visit.     PAST MEDICAL HISTORY: Past Medical History:  Diagnosis Date  . Arthritis   . Arthritis   . Back pain   . Diabetes mellitus   . Hypertension   . Knee pain     PAST SURGICAL HISTORY: Past Surgical History:  Procedure Laterality Date  . CESAREAN SECTION  1975    SOCIAL HISTORY: Social History   Tobacco Use  . Smoking status: Former Games developer  . Smokeless tobacco: Never Used  Substance Use Topics  . Alcohol use: Yes  . Drug use: No    FAMILY HISTORY: Family History  Problem Relation Age of Onset  . High blood pressure Mother   . High Cholesterol Mother   . Stroke Mother   .  Alcoholism Mother   . Obesity Mother   . Alcoholism Father   . Diabetes Father     ROS: Review of Systems  Constitutional: Positive for weight loss.  Cardiovascular: Negative for palpitations.  Endo/Heme/Allergies:       Negative hypoglycemia Negative hot/cold intolerance    PHYSICAL EXAM: Blood pressure 117/69, pulse 65, temperature 98.9 F (37.2 C), temperature source Oral, height 5\' 5"  (1.651 m), weight 201 lb (91.2 kg), SpO2 94 %. Body mass index is 33.45 kg/m. Physical Exam  Constitutional: She is oriented to person, place, and time. She appears well-developed and well-nourished.  Cardiovascular: Normal rate.  Pulmonary/Chest: Effort normal.  Musculoskeletal: Normal range of motion.  Neurological: She is oriented to  person, place, and time.  Skin: Skin is warm and dry.  Psychiatric: She has a normal mood and affect. Her behavior is normal.  Vitals reviewed.   RECENT LABS AND TESTS: BMET    Component Value Date/Time   NA 141 06/10/2018 1215   K 4.4 06/10/2018 1215   CL 101 06/10/2018 1215   CO2 25 06/10/2018 1215   GLUCOSE 92 06/10/2018 1215   BUN 13 06/10/2018 1215   CREATININE 0.84 06/10/2018 1215   CALCIUM 9.2 06/10/2018 1215   GFRNONAA 68 06/10/2018 1215   GFRAA 79 06/10/2018 1215   No results found for: HGBA1C Lab Results  Component Value Date   INSULIN 1.7 (L) 06/10/2018   CBC    Component Value Date/Time   WBC 5.2 06/10/2018 1215   RBC 4.27 06/10/2018 1215   HGB 13.2 06/10/2018 1215   HCT 41.9 06/10/2018 1215   MCV 98 (H) 06/10/2018 1215   MCH 30.9 06/10/2018 1215   MCHC 31.5 06/10/2018 1215   RDW 13.3 06/10/2018 1215   LYMPHSABS 2.5 06/10/2018 1215   EOSABS 0.1 06/10/2018 1215   BASOSABS 0.0 06/10/2018 1215   Iron/TIBC/Ferritin/ %Sat No results found for: IRON, TIBC, FERRITIN, IRONPCTSAT Lipid Panel     Component Value Date/Time   CHOL 164 06/10/2018 1215   TRIG 89 06/10/2018 1215   HDL 70 06/10/2018 1215   LDLCALC 76 06/10/2018 1215   Hepatic Function Panel     Component Value Date/Time   PROT 6.5 06/10/2018 1215   ALBUMIN 4.1 06/10/2018 1215   AST 14 06/10/2018 1215   ALT 14 06/10/2018 1215   ALKPHOS 80 06/10/2018 1215   BILITOT 0.3 06/10/2018 1215      Component Value Date/Time   TSH 2.940 06/10/2018 1215    ASSESSMENT AND PLAN: Type 2 diabetes mellitus without complication, without long-term current use of insulin (HCC)  Other specified hypothyroidism  Class 1 obesity with serious comorbidity and body mass index (BMI) of 33.0 to 33.9 in adult, unspecified obesity type  PLAN:  Diabetes II Jill Graham has been given extensive diabetes education by myself today including ideal fasting and post-prandial blood glucose readings, individual ideal Hgb A1c  goals and hypoglycemia prevention. We discussed the importance of good blood sugar control to decrease the likelihood of diabetic complications such as nephropathy, neuropathy, limb loss, blindness, coronary artery disease, and death. We discussed the importance of intensive lifestyle modification including diet, exercise and weight loss as the first line treatment for diabetes. Jill Graham agrees to continue taking metformin, no changes in medication, and she agrees to follow up with our clinic in 2 weeks.  Hypothyroidism Jill Graham was informed of the importance of good thyroid control to help with weight loss efforts. She was also informed that supertheraputic thyroid levels are  dangerous and will not improve weight loss results. Jill Graham agrees to continue taking levothyroxine and she agrees to follow up with our clinic in 2 weeks.  I spent > than 50% of the 15 minute visit on counseling as documented in the note.  Obesity Jill Graham is currently in the action stage of change. As such, her goal is to continue with weight loss efforts She has agreed to follow the Category 1 plan Jill Graham has been instructed to work up to a goal of 150 minutes of combined cardio and strengthening exercise per week for weight loss and overall health benefits. Given dinner ideas handout. We discussed the following Behavioral Modification Strategies today: increasing lean protein intake, increasing vegetables, work on meal planning and easy cooking plans, ways to avoid boredom eating, and better snacking choices   Jill Graham has agreed to follow up with our clinic in 2 weeks. She was informed of the importance of frequent follow up visits to maximize her success with intensive lifestyle modifications for her multiple health conditions.   OBESITY BEHAVIORAL INTERVENTION VISIT  Today's visit was # 3   Starting weight: 209 lbs Starting date: 06/10/18 Today's weight : 201 lbs  Today's date: 07/08/2018 Total lbs lost to date:  8    ASK: We discussed the diagnosis of obesity with Jill Graham today and Jill Graham agreed to give [name] permission to discuss obesity behavioral modification therapy today.  ASSESS: Jill Graham has the diagnosis of obesity and her BMI today is 33.45 Jill Graham is in the action stage of change   ADVISE: Jill Graham was educated on the multiple health risks of obesity as well as the benefit of weight loss to improve her health. She was advised of the need for long term treatment and the importance of lifestyle modifications to improve her current health and to decrease her risk of future health problems.  AGREE: Multiple dietary modification options and treatment options were discussed and  Jill Graham agreed to follow the recommendations documented in the above note.  ARRANGE: Jill Graham was educated on the importance of frequent visits to treat obesity as outlined per CMS and USPSTF guidelines and agreed to schedule her next follow up appointment today.  I, Burt Knack, am acting as transcriptionist for Debbra Riding, MD  I have reviewed the above documentation for accuracy and completeness, and I agree with the above. - Debbra Riding, MD

## 2018-07-22 ENCOUNTER — Ambulatory Visit (INDEPENDENT_AMBULATORY_CARE_PROVIDER_SITE_OTHER): Payer: Medicare Other | Admitting: Family Medicine

## 2018-07-22 VITALS — BP 126/68 | HR 60 | Temp 98.4°F | Ht 65.0 in | Wt 201.0 lb

## 2018-07-22 DIAGNOSIS — E669 Obesity, unspecified: Secondary | ICD-10-CM | POA: Diagnosis not present

## 2018-07-22 DIAGNOSIS — E038 Other specified hypothyroidism: Secondary | ICD-10-CM

## 2018-07-22 DIAGNOSIS — I10 Essential (primary) hypertension: Secondary | ICD-10-CM | POA: Diagnosis not present

## 2018-07-22 DIAGNOSIS — Z6833 Body mass index (BMI) 33.0-33.9, adult: Secondary | ICD-10-CM

## 2018-07-27 NOTE — Progress Notes (Signed)
Office: [phone]  /  Fax: [phone]   HPI:   Chief Complaint: OBESITY Jill Graham is here to discuss her progress with her obesity treatment plan. She is on the Category 1 plan and is following her eating plan approximately 95 % of the time. She states she is riding stationary bike for 20 minutes 3-4 times per week. Jill Graham notes hunger every 2 hours. She ran out of yogurt so she has not been doing all of her breakfast.  Her weight is 201 lb (91.2 kg) today and has not lost weight since her last visit. She has lost 8 lbs since starting treatment with [name].  Hypertension Jill Graham is a 77 y.o. female with hypertension. Jill Graham's blood pressure is controlled. She denies dizziness, chest pain, or chest pressure. She is working weight loss to help control her blood pressure with the goal of decreasing her risk of heart attack and stroke.   Hypothyroidism Jill Graham has a diagnosis of hypothyroidism. She is on Synthroid and denies hot or cold intolerance or palpitations.  ALLERGIES: No Known Allergies  MEDICATIONS: Current Outpatient Medications on File Prior to Visit  Medication Sig Dispense Refill  . acyclovir (ZOVIRAX) 400 MG tablet Take 400 mg by mouth every 12 (twelve) hours.    Marland Kitchen aspirin EC 81 MG tablet Take 81 mg by mouth daily.    . bisoprolol-hydrochlorothiazide (ZIAC) 5-6.25 MG per tablet Take 1 tablet by mouth daily.    . cholecalciferol (VITAMIN D) 1000 units tablet Take 1,000 Units by mouth daily.    Marland Kitchen FLUoxetine (PROZAC) 20 MG capsule Take 20 mg by mouth daily.    Marland Kitchen FLUoxetine (PROZAC) 40 MG capsule Take 40 mg by mouth daily.    Marland Kitchen levothyroxine (SYNTHROID, LEVOTHROID) 25 MCG tablet Take 25 mcg by mouth daily.    Marland Kitchen losartan (COZAAR) 100 MG tablet Take 100 mg by mouth daily.    . metFORMIN (GLUCOPHAGE) 500 MG tablet Take by mouth 2 (two) times daily with a meal.    . Multiple Vitamins-Minerals (MULTIVITAMIN WITH MINERALS) tablet Take 1 tablet by mouth daily.    . Omega-3 Fatty  Acids (FISH OIL) 1000 MG CAPS Take by mouth.    Marland Kitchen omeprazole (PRILOSEC) 40 MG capsule Take 40 mg by mouth daily.    . simvastatin (ZOCOR) 20 MG tablet Take 20 mg by mouth every evening.    . traZODone (DESYREL) 50 MG tablet Take 50 mg by mouth at bedtime.    . vitamin B-12 (CYANOCOBALAMIN) 1000 MCG tablet Take 1,000 mcg by mouth daily.     No current facility-administered medications on file prior to visit.     PAST MEDICAL HISTORY: Past Medical History:  Diagnosis Date  . Arthritis   . Arthritis   . Back pain   . Diabetes mellitus   . Hypertension   . Knee pain     PAST SURGICAL HISTORY: Past Surgical History:  Procedure Laterality Date  . CESAREAN SECTION  1975    SOCIAL HISTORY: Social History   Tobacco Use  . Smoking status: Former Games developer  . Smokeless tobacco: Never Used  Substance Use Topics  . Alcohol use: Yes  . Drug use: No    FAMILY HISTORY: Family History  Problem Relation Age of Onset  . High blood pressure Mother   . High Cholesterol Mother   . Stroke Mother   . Alcoholism Mother   . Obesity Mother   . Alcoholism Father   . Diabetes Father  ROS: Review of Systems  Constitutional: Negative for weight loss.  Cardiovascular: Negative for chest pain and palpitations.       Negative chest pressure  Neurological: Negative for dizziness.  Endo/Heme/Allergies:       Negative hot/cold intolerance    PHYSICAL EXAM: Blood pressure 126/68, pulse 60, temperature 98.4 F (36.9 C), temperature source Oral, height 5\' 5"  (1.651 m), weight 201 lb (91.2 kg), SpO2 95 %. Body mass index is 33.45 kg/m. Physical Exam  Constitutional: She is oriented to person, place, and time. She appears well-developed and well-nourished.  Cardiovascular: Normal rate.  Pulmonary/Chest: Effort normal.  Musculoskeletal: Normal range of motion.  Neurological: She is oriented to person, place, and time.  Skin: Skin is warm and dry.  Psychiatric: She has a normal mood and  affect. Her behavior is normal.  Vitals reviewed.   RECENT LABS AND TESTS: BMET    Component Value Date/Time   NA 141 06/10/2018 1215   K 4.4 06/10/2018 1215   CL 101 06/10/2018 1215   CO2 25 06/10/2018 1215   GLUCOSE 92 06/10/2018 1215   BUN 13 06/10/2018 1215   CREATININE 0.84 06/10/2018 1215   CALCIUM 9.2 06/10/2018 1215   GFRNONAA 68 06/10/2018 1215   GFRAA 79 06/10/2018 1215   No results found for: HGBA1C Lab Results  Component Value Date   INSULIN 1.7 (L) 06/10/2018   CBC    Component Value Date/Time   WBC 5.2 06/10/2018 1215   RBC 4.27 06/10/2018 1215   HGB 13.2 06/10/2018 1215   HCT 41.9 06/10/2018 1215   MCV 98 (H) 06/10/2018 1215   MCH 30.9 06/10/2018 1215   MCHC 31.5 06/10/2018 1215   RDW 13.3 06/10/2018 1215   LYMPHSABS 2.5 06/10/2018 1215   EOSABS 0.1 06/10/2018 1215   BASOSABS 0.0 06/10/2018 1215   Iron/TIBC/Ferritin/ %Sat No results found for: IRON, TIBC, FERRITIN, IRONPCTSAT Lipid Panel     Component Value Date/Time   CHOL 164 06/10/2018 1215   TRIG 89 06/10/2018 1215   HDL 70 06/10/2018 1215   LDLCALC 76 06/10/2018 1215   Hepatic Function Panel     Component Value Date/Time   PROT 6.5 06/10/2018 1215   ALBUMIN 4.1 06/10/2018 1215   AST 14 06/10/2018 1215   ALT 14 06/10/2018 1215   ALKPHOS 80 06/10/2018 1215   BILITOT 0.3 06/10/2018 1215      Component Value Date/Time   TSH 2.940 06/10/2018 1215    ASSESSMENT AND PLAN: Essential hypertension  Other specified hypothyroidism  Class 1 obesity with serious comorbidity and body mass index (BMI) of 33.0 to 33.9 in adult, unspecified obesity type  PLAN:  Hypertension We discussed sodium restriction, working on healthy weight loss, and a regular exercise program as the means to achieve improved blood pressure control. Jill Graham agreed with this plan and agreed to follow up as directed. We will continue to monitor her blood pressure as well as her progress with the above lifestyle  modifications. Jill Graham agrees to continue her current medications and will watch for signs of hypotension as she continues her lifestyle modifications. Jill Graham agrees to follow up with our clinic in 2 weeks.  Hypothyroidism Jill Graham was informed of the importance of good thyroid control to help with weight loss efforts. She was also informed that supertheraputic thyroid levels are dangerous and will not improve weight loss results. Jill Graham agrees to continue taking Synthroid and we will repeat labs in early December. Jill Graham agrees to follow up with our clinic in  2 weeks.  I spent > than 50% of the 15 minute visit on counseling as documented in the note.  Obesity Jill Graham is currently in the action stage of change. As such, her goal is to continue with weight loss efforts She has agreed to follow the Category 2 plan Jill Graham has been instructed to work up to a goal of 150 minutes of combined cardio and strengthening exercise per week for weight loss and overall health benefits. We discussed the following Behavioral Modification Strategies today: increasing lean protein intake, increasing vegetables, work on meal planning and easy cooking plans, better snacking choices, and planning for success   Jill Graham has agreed to follow up with our clinic in 2 weeks. She was informed of the importance of frequent follow up visits to maximize her success with intensive lifestyle modifications for her multiple health conditions.   OBESITY BEHAVIORAL INTERVENTION VISIT  Today's visit was # 4   Starting weight: 209 lbs Starting date: 06/10/18 Today's weight : 201 lbs  Today's date: 07/22/2018 Total lbs lost to date: 8    ASK: We discussed the diagnosis of obesity with Jill Graham today and Jill Graham agreed to give [name] permission to discuss obesity behavioral modification therapy today.  ASSESS: Jill Graham has the diagnosis of obesity and her BMI today is 33.45 Jill Graham is in the action stage of change   ADVISE: Jill Graham was  educated on the multiple health risks of obesity as well as the benefit of weight loss to improve her health. She was advised of the need for long term treatment and the importance of lifestyle modifications to improve her current health and to decrease her risk of future health problems.  AGREE: Multiple dietary modification options and treatment options were discussed and  Jill Graham agreed to follow the recommendations documented in the above note.  ARRANGE: Jill Graham was educated on the importance of frequent visits to treat obesity as outlined per CMS and USPSTF guidelines and agreed to schedule her next follow up appointment today.  I, Burt Knack, am acting as transcriptionist for Debbra Riding, MD  I have reviewed the above documentation for accuracy and completeness, and I agree with the above. - Debbra Riding, MD

## 2018-08-05 ENCOUNTER — Ambulatory Visit (INDEPENDENT_AMBULATORY_CARE_PROVIDER_SITE_OTHER): Payer: Medicare Other | Admitting: Family Medicine

## 2018-08-05 VITALS — BP 144/77 | HR 57 | Temp 97.8°F | Ht 65.0 in | Wt 201.0 lb

## 2018-08-05 DIAGNOSIS — Z6833 Body mass index (BMI) 33.0-33.9, adult: Secondary | ICD-10-CM

## 2018-08-05 DIAGNOSIS — E669 Obesity, unspecified: Secondary | ICD-10-CM | POA: Diagnosis not present

## 2018-08-05 DIAGNOSIS — I10 Essential (primary) hypertension: Secondary | ICD-10-CM

## 2018-08-05 DIAGNOSIS — E119 Type 2 diabetes mellitus without complications: Secondary | ICD-10-CM

## 2018-08-10 NOTE — Progress Notes (Signed)
Office: [phone]  /  Fax: [phone]   HPI:   Chief Complaint: OBESITY Jill Graham is here to discuss her progress with her obesity treatment plan. She is on the  follow the Category 2 plan and is following her eating plan approximately 70 % of the time. She states she is riding a stationary bike 10 minutes 2 times per week. Jill Graham occasionally is not getting all the meat in from her plan, especially at dinner.   Her weight is 201 lb (91.2 kg) today and has not lost weight since her last visit. She has lost 8 lbs since starting treatment with [name].  Hypertension Jill Graham is a 77 y.o. female with hypertension.  Jill Graham denies chest pain, chest pressure or shortness of breath on exertion or headaches. She is working weight loss to help control her blood pressure with the goal of decreasing her risk of heart attack and stroke. Dianes blood pressure is currently controlled on medication.    Diabetes II Jill Graham has a diagnosis of diabetes type II. Jill Graham states she is not checking blood sugars and denies any hypoglycemic episodes. She has been working on intensive lifestyle modifications including diet, exercise, and weight loss to help control her blood glucose levels.   ALLERGIES: No Known Allergies  MEDICATIONS: Current Outpatient Medications on File Prior to Visit  Medication Sig Dispense Refill  . acyclovir (ZOVIRAX) 400 MG tablet Take 400 mg by mouth every 12 (twelve) hours.    Marland Kitchen aspirin EC 81 MG tablet Take 81 mg by mouth daily.    . bisoprolol-hydrochlorothiazide (ZIAC) 5-6.25 MG per tablet Take 1 tablet by mouth daily.    . cholecalciferol (VITAMIN D) 1000 units tablet Take 1,000 Units by mouth daily.    Marland Kitchen FLUoxetine (PROZAC) 20 MG capsule Take 20 mg by mouth daily.    Marland Kitchen FLUoxetine (PROZAC) 40 MG capsule Take 40 mg by mouth daily.    Marland Kitchen levothyroxine (SYNTHROID, LEVOTHROID) 25 MCG tablet Take 25 mcg by mouth daily.    Marland Kitchen losartan (COZAAR) 100 MG tablet Take 100 mg by mouth  daily.    . metFORMIN (GLUCOPHAGE) 500 MG tablet Take by mouth 2 (two) times daily with a meal.    . Multiple Vitamins-Minerals (MULTIVITAMIN WITH MINERALS) tablet Take 1 tablet by mouth daily.    . Omega-3 Fatty Acids (FISH OIL) 1000 MG CAPS Take by mouth.    Marland Kitchen omeprazole (PRILOSEC) 40 MG capsule Take 40 mg by mouth daily.    . simvastatin (ZOCOR) 20 MG tablet Take 20 mg by mouth every evening.    . traZODone (DESYREL) 50 MG tablet Take 50 mg by mouth at bedtime.    . vitamin B-12 (CYANOCOBALAMIN) 1000 MCG tablet Take 1,000 mcg by mouth daily.     No current facility-administered medications on file prior to visit.     PAST MEDICAL HISTORY: Past Medical History:  Diagnosis Date  . Arthritis   . Arthritis   . Back pain   . Diabetes mellitus   . Hypertension   . Knee pain     PAST SURGICAL HISTORY: Past Surgical History:  Procedure Laterality Date  . CESAREAN SECTION  1975    SOCIAL HISTORY: Social History   Tobacco Use  . Smoking status: Former Games developer  . Smokeless tobacco: Never Used  Substance Use Topics  . Alcohol use: Yes  . Drug use: No    FAMILY HISTORY: Family History  Problem Relation Age of Onset  . High blood  pressure Mother   . High Cholesterol Mother   . Stroke Mother   . Alcoholism Mother   . Obesity Mother   . Alcoholism Father   . Diabetes Father     ROS: Review of Systems  Constitutional: Negative for weight loss.  Respiratory: Negative for shortness of breath.   Cardiovascular: Negative for chest pain.       Negative for chest pressure  Neurological: Negative for headaches.  Endo/Heme/Allergies:       Negative for hypoglycemia     PHYSICAL EXAM: Blood pressure (!) 144/77, pulse (!) 57, temperature 97.8 F (36.6 C), temperature source Oral, height 5\' 5"  (1.651 m), weight 201 lb (91.2 kg), SpO2 97 %. Body mass index is 33.45 kg/m. Physical Exam  Constitutional: She is oriented to person, place, and time. She appears well-developed  and well-nourished.  Cardiovascular: Normal rate.  Pulmonary/Chest: Effort normal.  Musculoskeletal: Normal range of motion.  Neurological: She is alert and oriented to person, place, and time.  Skin: Skin is warm and dry.  Psychiatric: She has a normal mood and affect. Her behavior is normal.  Vitals reviewed.   RECENT LABS AND TESTS: BMET    Component Value Date/Time   NA 141 06/10/2018 1215   K 4.4 06/10/2018 1215   CL 101 06/10/2018 1215   CO2 25 06/10/2018 1215   GLUCOSE 92 06/10/2018 1215   BUN 13 06/10/2018 1215   CREATININE 0.84 06/10/2018 1215   CALCIUM 9.2 06/10/2018 1215   GFRNONAA 68 06/10/2018 1215   GFRAA 79 06/10/2018 1215   No results found for: HGBA1C Lab Results  Component Value Date   INSULIN 1.7 (L) 06/10/2018   CBC    Component Value Date/Time   WBC 5.2 06/10/2018 1215   RBC 4.27 06/10/2018 1215   HGB 13.2 06/10/2018 1215   HCT 41.9 06/10/2018 1215   MCV 98 (H) 06/10/2018 1215   MCH 30.9 06/10/2018 1215   MCHC 31.5 06/10/2018 1215   RDW 13.3 06/10/2018 1215   LYMPHSABS 2.5 06/10/2018 1215   EOSABS 0.1 06/10/2018 1215   BASOSABS 0.0 06/10/2018 1215   Iron/TIBC/Ferritin/ %Sat No results found for: IRON, TIBC, FERRITIN, IRONPCTSAT Lipid Panel     Component Value Date/Time   CHOL 164 06/10/2018 1215   TRIG 89 06/10/2018 1215   HDL 70 06/10/2018 1215   LDLCALC 76 06/10/2018 1215   Hepatic Function Panel     Component Value Date/Time   PROT 6.5 06/10/2018 1215   ALBUMIN 4.1 06/10/2018 1215   AST 14 06/10/2018 1215   ALT 14 06/10/2018 1215   ALKPHOS 80 06/10/2018 1215   BILITOT 0.3 06/10/2018 1215      Component Value Date/Time   TSH 2.940 06/10/2018 1215   Results for Jill Graham, Jill Graham (MRN [id number]) as of 08/10/2018 16:39  Ref. Range 06/10/2018 12:15  Vitamin D, 25-Hydroxy Latest Ref Range: 30.0 - 100.0 ng/mL 75.7    ASSESSMENT AND PLAN: Type 2 diabetes mellitus without complication, without long-term current use of insulin  (HCC)  Essential hypertension  Class 1 obesity with serious comorbidity and body mass index (BMI) of 33.0 to 33.9 in adult, unspecified obesity type  PLAN: Hypertension We discussed sodium restriction, working on healthy weight loss, and a regular exercise program as the means to achieve improved blood pressure control. Charie agreed with this plan. We will continue to monitor her blood pressure as well as her progress with the above lifestyle modifications. She will continue her medications as prescribed and  will watch for signs of hypotension as she continues her lifestyle modifications. Redith agreed to follow up in our office as needed.   Diabetes II Jill Graham has been given extensive diabetes education by myself today including ideal fasting and post-prandial blood glucose readings, individual ideal HgA1c goals  and hypoglycemia prevention. We discussed the importance of good blood sugar control to decrease the likelihood of diabetic complications such as nephropathy, neuropathy, limb loss, blindness, coronary artery disease, and death. We discussed the importance of intensive lifestyle modification including diet, exercise and weight loss as the first line treatment for diabetes. Jill Graham agrees to continue her diabetes medications and will follow up with our office as needed.   Obesity Jill Graham is not currently in the action stage of change. As such, her goal is to continue with weight loss efforts She has agreed to follow the Category 2 plan: Supper journal 400-500 calories and 35+ gm protein Jill Graham has been instructed to work up to a goal of 150 minutes of combined cardio and strengthening exercise per week for weight loss and overall health benefits. We discussed the following Behavioral Modification Strategies today: increasing lean protein intake, meal planning and cooking strategies and keeping a strict food journal.   Jill Graham has agreed to follow up with our clinic as needed. She was informed of  the importance of frequent follow up visits to maximize her success with intensive lifestyle modifications for her multiple health conditions.   OBESITY BEHAVIORAL INTERVENTION VISIT  Today's visit was # 5   Starting weight: 209 Starting date: 06/10/2018 Today's weight : Weight: 201 lb (91.2 kg)  Today's date: 08/05/2018 Total lbs lost to date: 8 lbs   ASK: We discussed the diagnosis of obesity with Jill Graham today and Jill Graham agreed to give [name] permission to discuss obesity behavioral modification therapy today.  ASSESS: Abby has the diagnosis of obesity and her BMI today is 33.45 Shakoya is not in the action stage of change   ADVISE: Jill Graham was educated on the multiple health risks of obesity as well as the benefit of weight loss to improve her health. She was advised of the need for long term treatment and the importance of lifestyle modifications to improve her current health and to decrease her risk of future health problems.  AGREE: Multiple dietary modification options and treatment options were discussed and  Jill Graham agreed to follow the recommendations documented in the above note.  ARRANGE: Marcellina was educated on the importance of frequent visits to treat obesity as outlined per CMS and USPSTF guidelines and agreed to schedule her next follow up appointment today.  I, Ellery Plunk, CMA, am acting as transcriptionist for Quillian Quince MD  I have reviewed the above documentation for accuracy and completeness, and I agree with the above. - Debbra Riding, MD

## 2018-09-15 ENCOUNTER — Encounter: Payer: Self-pay | Admitting: Internal Medicine

## 2018-09-15 ENCOUNTER — Ambulatory Visit (INDEPENDENT_AMBULATORY_CARE_PROVIDER_SITE_OTHER): Payer: Medicare Other | Admitting: Internal Medicine

## 2018-09-15 VITALS — BP 150/70 | HR 76 | Temp 98.5°F | Wt 202.8 lb

## 2018-09-15 DIAGNOSIS — E038 Other specified hypothyroidism: Secondary | ICD-10-CM

## 2018-09-15 DIAGNOSIS — K219 Gastro-esophageal reflux disease without esophagitis: Secondary | ICD-10-CM

## 2018-09-15 DIAGNOSIS — F339 Major depressive disorder, recurrent, unspecified: Secondary | ICD-10-CM | POA: Insufficient documentation

## 2018-09-15 DIAGNOSIS — E119 Type 2 diabetes mellitus without complications: Secondary | ICD-10-CM | POA: Diagnosis not present

## 2018-09-15 DIAGNOSIS — I10 Essential (primary) hypertension: Secondary | ICD-10-CM

## 2018-09-15 LAB — POCT GLYCOSYLATED HEMOGLOBIN (HGB A1C): HEMOGLOBIN A1C: 5.2 % (ref 4.0–5.6)

## 2018-09-15 NOTE — Progress Notes (Signed)
New Patient Office Visit     CC/Reason for Visit: Establish care, follow-up chronic medical conditions Previous PCP: Dennis Bast, MD Last Visit: October 2019  HPI: Jill Graham is a 77 y.o. female who is coming in today for the above mentioned reasons.  She was previously seen by Dr. Luiz Iron in [facility] however due to convenience with traveling would prefer to establish care in her hometown of [place].  Past Medical History is significant for: Type 2 diabetes that has been well controlled on metformin, hypertension previously well controlled on losartan and Ziac, depression with stable mood on Prozac, also has a history of left knee osteoarthritis and follows with an orthopedic surgeon in [place] [facility].  She has no acute complaints at today's visit other than continued left knee pain.  She is currently seeing Dr[facility]inaldo [facility]el [facility]   Past Medical/Surgical History: Past Medical History:  Diagnosis Date  . Arthritis   . Arthritis   . Back pain   . Depression   . Diabetes mellitus   . GERD (gastroesophageal reflux disease)   . Hypertension   . Knee pain     Past Surgical History:  Procedure Laterality Date  . CESAREAN SECTION  1975    Social History:  reports that she has quit smoking. She has never used smokeless tobacco. She reports that she drinks alcohol. She reports that she does not use drugs.  Allergies: No Known Allergies  Family History:  Family History  Problem Relation Age of Onset  . High blood pressure Mother   . High Cholesterol Mother   . Stroke Mother   . Obesity Mother   . Diabetes Father   . Diabetes Maternal Aunt   . Diabetes Maternal Uncle   . Lung cancer Paternal Aunt      Current Outpatient Medications:  .  acyclovir (ZOVIRAX) 400 MG tablet, Take 400 mg by mouth every 12 (twelve) hours., Disp: , Rfl:  .  aspirin EC 81 MG tablet, Take 81 mg by mouth daily., Disp: , Rfl:  .  bisoprolol-hydrochlorothiazide  (ZIAC) 5-6.25 MG per tablet, Take 1 tablet by mouth daily., Disp: , Rfl:  .  cholecalciferol (VITAMIN D) 1000 units tablet, Take 1,000 Units by mouth daily., Disp: , Rfl:  .  FLUoxetine (PROZAC) 20 MG capsule, Take 20 mg by mouth daily., Disp: , Rfl:  .  FLUoxetine (PROZAC) 40 MG capsule, Take 40 mg by mouth daily., Disp: , Rfl:  .  levothyroxine (SYNTHROID, LEVOTHROID) 25 MCG tablet, Take 25 mcg by mouth daily., Disp: , Rfl:  .  losartan (COZAAR) 100 MG tablet, Take 100 mg by mouth daily., Disp: , Rfl:  .  metFORMIN (GLUCOPHAGE) 500 MG tablet, Take by mouth 2 (two) times daily with a meal., Disp: , Rfl:  .  Multiple Vitamins-Minerals (MULTIVITAMIN WITH MINERALS) tablet, Take 1 tablet by mouth daily., Disp: , Rfl:  .  Omega-3 Fatty Acids (FISH OIL) 1000 MG CAPS, Take by mouth., Disp: , Rfl:  .  omeprazole (PRILOSEC) 40 MG capsule, Take 40 mg by mouth daily., Disp: , Rfl:  .  simvastatin (ZOCOR) 20 MG tablet, Take 20 mg by mouth every evening., Disp: , Rfl:  .  traZODone (DESYREL) 50 MG tablet, Take 50 mg by mouth at bedtime., Disp: , Rfl:  .  vitamin B-12 (CYANOCOBALAMIN) 1000 MCG tablet, Take 1,000 mcg by mouth daily., Disp: , Rfl:   Review of Systems:  Constitutional: Denies fever, chills,  diaphoresis, appetite change and fatigue.  HEENT: Denies photophobia, eye pain, redness, hearing loss, ear pain, congestion, sore throat, rhinorrhea, sneezing, mouth sores, trouble swallowing, neck pain, neck stiffness and tinnitus.   Respiratory: Denies SOB, DOE, cough, chest tightness,  and wheezing.   Cardiovascular: Denies chest pain, palpitations and leg swelling.  Gastrointestinal: Denies nausea, vomiting, abdominal pain, diarrhea, constipation, blood in stool and abdominal distention.  Genitourinary: Denies dysuria, urgency, frequency, hematuria, flank pain and difficulty urinating.  Endocrine: Denies: hot or cold intolerance, sweats, changes in hair or nails, polyuria, polydipsia. Musculoskeletal:  Denies myalgias, back pain, joint swelling, arthralgias and has it for gait problem due to ongoing left knee pain.  Skin: Denies pallor, rash and wound.  Neurological: Denies dizziness, seizures, syncope, weakness, light-headedness, numbness and headaches.  Hematological: Denies adenopathy. Easy bruising, personal or family bleeding history  Psychiatric/Behavioral: Denies suicidal ideation, mood changes, confusion, nervousness, sleep disturbance and agitation    Physical Exam: Vitals:   09/15/18 1559  BP: (!) 150/70  Pulse: 76  Temp: 98.5 F (36.9 C)  TempSrc: Oral  SpO2: 98%  Weight: 202 lb 12.8 oz (92 kg)   Body mass index is 33.75 kg/m.  Constitutional: NAD, calm, comfortable Eyes: PERRL, lids and conjunctivae normal ENMT: Mucous membranes are moist. Posterior pharynx clear of any exudate or lesions. Normal dentition.  Neck: normal, supple, no masses, no thyromegaly Respiratory: clear to auscultation bilaterally, no wheezing, no crackles. Normal respiratory effort. No accessory muscle use.  Cardiovascular: Regular rate and rhythm, no murmurs / rubs / gallops. No extremity edema. 2+ pedal pulses. No carotid bruits.  Abdomen: no tenderness, no masses palpated. No hepatosplenomegaly. Bowel sounds positive.  Musculoskeletal: no clubbing / cyanosis. No joint deformity upper and lower extremities. Good ROM, no contractures. Normal muscle tone.  Skin: no rashes, lesions, ulcers. No induration Neurologic: Grossly intact and nonfocal Psychiatric: Normal judgment and insight. Alert and oriented x 3. Normal mood.    Impression and Plan:  Type 2 diabetes mellitus without complication, without long-term current use of insulin (HCC) Lab Results  Component Value Date   HGBA1C 5.2 09/15/2018   -Well-controlled on metformin. -Foot exam today documented in chart.  Other specified hypothyroidism -On stable dose of levothyroxine, TSH normal at 2.94 in August 2019.  Essential  hypertension -Blood pressure remains elevated today, 150/75 on recheck. -Blood pressure has been previously well controlled, she does admit to certain anxiety in coming to establish care at a new office with a new provider. -Have encouraged blood pressure monitoring at home, will follow-up with me in 6 months for blood pressure check.  Depression, recurrent (HCC) -Stable mood on Prozac.  Gastroesophageal reflux disease without esophagitis -On omeprazole.  If she does not take omeprazole has daily symptoms.     Patient Instructions  -It was nice meeting you!  -Please schedule follow-up with me in 6 weeks for blood pressure check.  -Please schedule your annual Medicare wellness visit with our wellness coach.  -Continue your efforts towards a healthy lifestyle including increased physical activity and healthy eating.     Chaya JanEstela Hernandez Acosta, MD [facility]

## 2018-09-15 NOTE — Patient Instructions (Signed)
-  It was nice meeting you!  -Please schedule follow-up with me in 6 weeks for blood pressure check.  -Please schedule your annual Medicare wellness visit with our wellness coach.  -Continue your efforts towards a healthy lifestyle including increased physical activity and healthy eating.

## 2018-09-16 ENCOUNTER — Telehealth: Payer: Self-pay | Admitting: Internal Medicine

## 2018-09-16 ENCOUNTER — Ambulatory Visit: Payer: Self-pay | Admitting: *Deleted

## 2018-09-16 NOTE — Telephone Encounter (Signed)
  Reason for Disposition . Caller has URGENT medication question about med that PCP prescribed and triager unable to answer question  Answer Assessment - Initial Assessment Questions 1. SYMPTOMS: "Do you have any symptoms?"     Pain in left knee 2. SEVERITY: If symptoms are present, ask "Are they mild, moderate or severe?"    Pain # about 7  Protocols used: MEDICATION QUESTION CALL-A-AH

## 2018-09-16 NOTE — Telephone Encounter (Signed)
Call placed to patient Pt requesting medication for knee pain. Left VM to office

## 2018-09-16 NOTE — Telephone Encounter (Signed)
Pt requesting pain med for left knee, which she has osteoarthritis in it. She saw her provider yesterday and did not get medication for it. She states her knee hurts when she is trying to walk and it pain #7 on the pain scale.  [facility] to check to see if her provider can give her medication for her knee. Her provider recommends that she try icing her knee and taking ibuprofen for the pain.  Patient wanted to know how much she could take. She can take up to 800 mg about every 6 hours as needed. She had been taking Extra strength Tylenol and she stated that that did not help. Also recommend that she elevate her leg, which is doing already. Advised her to call back tomorrow and let us know how she was doing with the recommendation. Pt voiced understanding.

## 2018-09-16 NOTE — Telephone Encounter (Signed)
Copied from CRM [id number]. Topic: Quick Communication - Rx Refill/Question >> Sep 16, 2018  3:48 PM Rica KoyanagiWeikart, Barbee CoughMelissa J wrote: Medication: something for bad knee pain medication   Has the patient contacted their pharmacy? No. (Agent: If no, request that the patient contact the pharmacy for the refill.) (Agent: If yes, when and what did the pharmacy advise?)  Preferred Pharmacy (with phone number or street name): [facility]  Pt saw Dr yesterday and forgot to ask for medication for knee.  She can not take meloxicam - it makes her bp go up    Agent: Please be advised that RX refills may take up to 3 business days. We ask that you follow-up with your pharmacy.

## 2018-09-17 NOTE — Telephone Encounter (Signed)
Not sure what the question here is?

## 2018-09-17 NOTE — Telephone Encounter (Signed)
Per notes of Dr.Hernandez, pt needs to ice area and take ibuprofen for relief. Pt notified of update via PEC. No further action needed!

## 2018-09-18 NOTE — Telephone Encounter (Signed)
Spoke with patient and an appointment has been made. 

## 2018-09-22 ENCOUNTER — Encounter: Payer: Self-pay | Admitting: Internal Medicine

## 2018-09-22 ENCOUNTER — Ambulatory Visit (INDEPENDENT_AMBULATORY_CARE_PROVIDER_SITE_OTHER): Payer: Medicare Other | Admitting: Internal Medicine

## 2018-09-22 DIAGNOSIS — M1712 Unilateral primary osteoarthritis, left knee: Secondary | ICD-10-CM

## 2018-09-22 NOTE — Progress Notes (Signed)
Acute office Visit     CC/Reason for Visit: Left knee pain  HPI: Scot JunDiane E Enochs is a 77 y.o. female who is coming in today for the above mentioned reasons.  I saw her earlier this month for her transfer of care appointment.  She had mentioned at that time that she had intermittent left knee pain.  [facility] she has been diagnosed with left knee osteoarthritis.  The past 3 days she has had significant flareup of her OA.  She states it typically flares up when the weather gets cold and rainy.  She tried icing, Tylenol and CBD oil without significant relief.  She [facility], will try and obtain.  She has no acute complaints today.   Past Medical/Surgical History: Past Medical History:  Diagnosis Date  . Arthritis   . Arthritis   . Back pain   . Depression   . Diabetes mellitus   . GERD (gastroesophageal reflux disease)   . Hypertension   . Knee pain     Past Surgical History:  Procedure Laterality Date  . CESAREAN SECTION  1975    Social History:  reports that she has quit smoking. She has never used smokeless tobacco. She reports that she drinks alcohol. She reports that she does not use drugs.  Allergies: No Known Allergies  Family History:  Family History  Problem Relation Age of Onset  . High blood pressure Mother   . High Cholesterol Mother   . Stroke Mother   . Obesity Mother   . Diabetes Father   . Diabetes Maternal Aunt   . Diabetes Maternal Uncle   . Lung cancer Paternal Aunt      Current Outpatient Medications:  .  acyclovir (ZOVIRAX) 400 MG tablet, Take 400 mg by mouth every 12 (twelve) hours., Disp: , Rfl:  .  aspirin EC 81 MG tablet, Take 81 mg by mouth daily., Disp: , Rfl:  .  bisoprolol-hydrochlorothiazide (ZIAC) 5-6.25 MG per tablet, Take 1 tablet by mouth daily., Disp: , Rfl:  .  cholecalciferol (VITAMIN D) 1000 units tablet, Take 1,000 Units by  mouth daily., Disp: , Rfl:  .  FLUoxetine (PROZAC) 20 MG capsule, Take 20 mg by mouth daily., Disp: , Rfl:  .  FLUoxetine (PROZAC) 40 MG capsule, Take 40 mg by mouth daily., Disp: , Rfl:  .  levothyroxine (SYNTHROID, LEVOTHROID) 25 MCG tablet, Take 25 mcg by mouth daily., Disp: , Rfl:  .  losartan (COZAAR) 100 MG tablet, Take 100 mg by mouth daily., Disp: , Rfl:  .  metFORMIN (GLUCOPHAGE) 500 MG tablet, Take by mouth 2 (two) times daily with a meal., Disp: , Rfl:  .  Multiple Vitamins-Minerals (MULTIVITAMIN WITH MINERALS) tablet, Take 1 tablet by mouth daily., Disp: , Rfl:  .  Omega-3 Fatty Acids (FISH OIL) 1000 MG CAPS, Take by mouth., Disp: , Rfl:  .  omeprazole (PRILOSEC) 40 MG capsule, Take 40 mg by mouth daily., Disp: , Rfl:  .  simvastatin (ZOCOR) 20 MG tablet, Take 20 mg by mouth every evening., Disp: , Rfl:  .  traZODone (DESYREL) 50 MG tablet, Take 50 mg by mouth at bedtime., Disp: , Rfl:  .  vitamin B-12 (CYANOCOBALAMIN) 1000 MCG tablet, Take 1,000 mcg by mouth daily., Disp: , Rfl:   Review of Systems:  Constitutional: Denies fever, chills, diaphoresis, appetite change and fatigue.  Respiratory:  Denies SOB, DOE, cough, chest tightness,  and wheezing.   Cardiovascular: Denies chest pain, palpitations and leg swelling.  Gastrointestinal: Denies nausea, vomiting, abdominal pain, diarrhea, constipation, blood in stool and abdominal distention.  Endocrine: Denies: hot or cold intolerance, sweats, changes in hair or nails, polyuria, polydipsia. Musculoskeletal: Denies myalgias, back pain, joint swelling, arthralgias and gait problem.  Neurological: Denies dizziness, seizures, syncope, weakness, light-headedness, numbness and headaches.  Hematological: Denies adenopathy. Easy bruising, personal or family bleeding history  Psychiatric/Behavioral: Denies suicidal ideation, mood changes, confusion, nervousness, sleep disturbance and agitation    Physical Exam: Vitals:   09/22/18 0902    BP: 140/90  Pulse: 74  Temp: 99.3 F (37.4 C)  TempSrc: Oral  SpO2: 96%  Weight: 202 lb 14.4 oz (92 kg)    Body mass index is 33.76 kg/m.   Constitutional: NAD, calm, comfortable Eyes: PERRL, lids and conjunctivae normal, wears corrective lenses Respiratory: clear to auscultation bilaterally, no wheezing, no crackles. Normal respiratory effort. No accessory muscle use.  Cardiovascular: Regular rate and rhythm, no murmurs / rubs / gallops. No extremity edema. 2+ pedal pulses. No carotid bruits.  Abdomen: no tenderness, no masses palpated. No hepatosplenomegaly. Bowel sounds positive.  Musculoskeletal: no clubbing / cyanosis. No joint deformity upper and lower extremities. Good ROM (including of the left knee), no contractures. Normal muscle tone.  Neurologic: Grossly intact and nonfocal Psychiatric: Normal judgment and insight. Alert and oriented x 3. Normal mood.    Impression and Plan:  Primary osteoarthritis of left knee -We have discussed conservative measures including weight loss, custom insoles, ice, ibuprofen.  She would like to try these measures before being referred back to orthopedics for possibly knee injections versus surgical management. -She will obtain over-the-counter insoles as she states she cannot afford the custom insoles yet, but will keep in mind that referral to sports medicine for custom insoles might be a beneficial step prior to consideration of surgical replacement of the knee.     Patient Instructions  -It was nice to see you today and I hope your left knee  feels better with the measures that we have addressed.  -Remember to use ice and 2 tablets of ibuprofen up to every 8 hours to not exceed 5 days tops when the knee is flared up.  -Insoles and weight loss can also be helpful.  -If you continue to have issues, we can discuss referring him back to orthopedics for knee injections/talks of replacement.     Chaya Jan, MD [place]  [facility]

## 2018-09-22 NOTE — Patient Instructions (Signed)
-  It was nice to see you today and I hope your left knee  feels better with the measures that we have addressed.  -Remember to use ice and 2 tablets of ibuprofen up to every 8 hours to not exceed 5 days tops when the knee is flared up.  -Insoles and weight loss can also be helpful.  -If you continue to have issues, we can discuss referring him back to orthopedics for knee injections/talks of replacement.

## 2018-10-23 ENCOUNTER — Telehealth: Payer: Self-pay | Admitting: *Deleted

## 2018-10-23 MED ORDER — LOSARTAN POTASSIUM 100 MG PO TABS: 100.0000 mg | ORAL_TABLET | Freq: Every day | ORAL | 1 refills | Status: DC

## 2018-10-23 MED ORDER — SIMVASTATIN 20 MG PO TABS: 20.0000 mg | ORAL_TABLET | Freq: Every evening | ORAL | 1 refills | Status: DC

## 2018-10-23 MED ORDER — LEVOTHYROXINE SODIUM 25 MCG PO TABS: 25.0000 ug | ORAL_TABLET | Freq: Every day | ORAL | 1 refills | Status: DC

## 2018-10-23 MED ORDER — ACYCLOVIR 400 MG PO TABS: 400.0000 mg | ORAL_TABLET | Freq: Two times a day (BID) | ORAL | 1 refills | Status: DC

## 2018-10-23 MED ORDER — BISOPROLOL-HYDROCHLOROTHIAZIDE 5-6.25 MG PO TABS: 1.0000 | ORAL_TABLET | Freq: Every day | ORAL | 1 refills | Status: DC

## 2018-10-23 NOTE — Telephone Encounter (Signed)
Refills sent

## 2018-10-23 NOTE — Telephone Encounter (Signed)
Copied from CRM [id number]. Topic: General - Other >> Oct 23, 2018  1:39 PM Jilda Roche wrote: Reason for CRM: Patient called and states that her refills were sent to the wrong Dr for renewal, they are supposed to be authorized by Dr. Ardyth Harps. levothyroxine (SYNTHROID, LEVOTHROID) 25 MCG tablet,simvastatin (ZOCOR) 20 MG tablet ,losartan (COZAAR) 100 MG tablet,bisoprolol-hydrochlorothiazide (ZIAC) 5-6.25 MG per tablet,acyclovir (ZOVIRAX) 400 MG tablet   Patient states that a call needs to be made to [facility] and change the name on the registery to her new Dr. Ardyth Harps to authorize the refills  Please advise  Best call back is [phone]   [facility] phone is [phone]   Fax is [phone]

## 2018-10-27 ENCOUNTER — Ambulatory Visit: Payer: Medicare Other | Admitting: Internal Medicine

## 2018-10-29 ENCOUNTER — Ambulatory Visit (INDEPENDENT_AMBULATORY_CARE_PROVIDER_SITE_OTHER): Payer: Medicare Other | Admitting: Internal Medicine

## 2018-10-29 ENCOUNTER — Encounter: Payer: Self-pay | Admitting: Internal Medicine

## 2018-10-29 VITALS — BP 130/80 | HR 69 | Temp 99.0°F | Ht 65.0 in | Wt 200.9 lb

## 2018-10-29 DIAGNOSIS — M1712 Unilateral primary osteoarthritis, left knee: Secondary | ICD-10-CM | POA: Diagnosis not present

## 2018-10-29 DIAGNOSIS — E038 Other specified hypothyroidism: Secondary | ICD-10-CM

## 2018-10-29 DIAGNOSIS — Z13820 Encounter for screening for osteoporosis: Secondary | ICD-10-CM | POA: Diagnosis not present

## 2018-10-29 DIAGNOSIS — K219 Gastro-esophageal reflux disease without esophagitis: Secondary | ICD-10-CM

## 2018-10-29 DIAGNOSIS — F339 Major depressive disorder, recurrent, unspecified: Secondary | ICD-10-CM

## 2018-10-29 DIAGNOSIS — E119 Type 2 diabetes mellitus without complications: Secondary | ICD-10-CM

## 2018-10-29 DIAGNOSIS — B0089 Other herpesviral infection: Secondary | ICD-10-CM

## 2018-10-29 DIAGNOSIS — I10 Essential (primary) hypertension: Secondary | ICD-10-CM | POA: Diagnosis not present

## 2018-10-29 DIAGNOSIS — Z0000 Encounter for general adult medical examination without abnormal findings: Secondary | ICD-10-CM | POA: Diagnosis not present

## 2018-10-29 LAB — CBC WITH DIFFERENTIAL/PLATELET
Basophils Absolute: 0.1 10*3/uL (ref 0.0–0.1)
Basophils Relative: 1.2 % (ref 0.0–3.0)
EOS PCT: 2.3 % (ref 0.0–5.0)
Eosinophils Absolute: 0.1 10*3/uL (ref 0.0–0.7)
HCT: 38.6 % (ref 36.0–46.0)
Hemoglobin: 12.8 g/dL (ref 12.0–15.0)
Lymphocytes Relative: 40.1 % (ref 12.0–46.0)
Lymphs Abs: 2 10*3/uL (ref 0.7–4.0)
MCHC: 33.3 g/dL (ref 30.0–36.0)
MCV: 96.2 fl (ref 78.0–100.0)
Monocytes Absolute: 0.5 10*3/uL (ref 0.1–1.0)
Monocytes Relative: 9.3 % (ref 3.0–12.0)
Neutro Abs: 2.3 10*3/uL (ref 1.4–7.7)
Neutrophils Relative %: 47.1 % (ref 43.0–77.0)
Platelets: 233 10*3/uL (ref 150.0–400.0)
RBC: 4.01 Mil/uL (ref 3.87–5.11)
RDW: 12.9 % (ref 11.5–15.5)
WBC: 4.9 10*3/uL (ref 4.0–10.5)

## 2018-10-29 LAB — LIPID PANEL
Cholesterol: 163 mg/dL (ref 0–200)
HDL: 74.3 mg/dL (ref >39.00)
LDL Cholesterol: 74 mg/dL (ref 0–99)
NonHDL: 88.78
Total CHOL/HDL Ratio: 2
Triglycerides: 75 mg/dL (ref 0.0–149.0)
VLDL: 15 mg/dL (ref 0.0–40.0)

## 2018-10-29 LAB — MICROALBUMIN / CREATININE URINE RATIO
Creatinine,U: 41.5 mg/dL
MICROALB/CREAT RATIO: 1.7 mg/g (ref 0.0–30.0)
Microalb, Ur: <0.7 mg/dL (ref 0.0–1.9)

## 2018-10-29 LAB — COMPREHENSIVE METABOLIC PANEL
ALT: 11 U/L (ref 0–35)
AST: 15 U/L (ref 0–37)
Albumin: 4.2 g/dL (ref 3.5–5.2)
Alkaline Phosphatase: 74 U/L (ref 39–117)
BUN: 18 mg/dL (ref 6–23)
CO2: 32 mEq/L (ref 19–32)
Calcium: 9.7 mg/dL (ref 8.4–10.5)
Chloride: 101 mEq/L (ref 96–112)
Creatinine, Ser: 0.98 mg/dL (ref 0.40–1.20)
GFR: 66.71 mL/min (ref >60.00)
Glucose, Bld: 92 mg/dL (ref 70–99)
POTASSIUM: 4.3 meq/L (ref 3.5–5.1)
SODIUM: 140 meq/L (ref 135–145)
Total Bilirubin: 0.4 mg/dL (ref 0.2–1.2)
Total Protein: 6.6 g/dL (ref 6.0–8.3)

## 2018-10-29 LAB — VITAMIN D 25 HYDROXY (VIT D DEFICIENCY, FRACTURES): VITD: 82.49 ng/mL (ref 30.00–100.00)

## 2018-10-29 LAB — TSH: TSH: 3.35 u[IU]/mL (ref 0.35–4.50)

## 2018-10-29 MED ORDER — ACYCLOVIR 400 MG PO TABS: 400.0000 mg | ORAL_TABLET | Freq: Two times a day (BID) | ORAL | 1 refills | Status: DC

## 2018-10-29 NOTE — Patient Instructions (Signed)
-It was nice seeing you today!  -Lab work today. We will notify you when results are available.  -Referrals to eye doctor and orthopedics have been requested.  -Take metformin twice a day.  -Take acyclovir once daily for prevention.  -Schedule follow up in 3 months. No need to come fasting to that visit.   Preventive Care 77 Years and Older, Female Preventive care refers to lifestyle choices and visits with your health care provider that can promote health and wellness. What does preventive care include?  A yearly physical exam. This is also called an annual well check.  Dental exams once or twice a year.  Routine eye exams. Ask your health care provider how often you should have your eyes checked.  Personal lifestyle choices, including: ? Daily care of your teeth and gums. ? Regular physical activity. ? Eating a healthy diet. ? Avoiding tobacco and drug use. ? Limiting alcohol use. ? Practicing safe sex. ? Taking low-dose aspirin every day. ? Taking vitamin and mineral supplements as recommended by your health care provider. What happens during an annual well check? The services and screenings done by your health care provider during your annual well check will depend on your age, overall health, lifestyle risk factors, and family history of disease. Counseling Your health care provider may ask you questions about your:  Alcohol use.  Tobacco use.  Drug use.  Emotional well-being.  Home and relationship well-being.  Sexual activity.  Eating habits.  History of falls.  Memory and ability to understand (cognition).  Work and work Statistician.  Reproductive health.  Screening You may have the following tests or measurements:  Height, weight, and BMI.  Blood pressure.  Lipid and cholesterol levels. These may be checked every 5 years, or more frequently if you are over 13 years old.  Skin check.  Lung cancer screening. You may have this screening  every year starting at age 62 if you have a 30-pack-year history of smoking and currently smoke or have quit within the past 15 years.  Colorectal cancer screening. All adults should have this screening starting at age 4 and continuing until age 72. You will have tests every 1-10 years, depending on your results and the type of screening test. People at increased risk should start screening at an earlier age. Screening tests may include: ? Guaiac-based fecal occult blood testing. ? Fecal immunochemical test (FIT). ? Stool DNA test. ? Virtual colonoscopy. ? Sigmoidoscopy. During this test, a flexible tube with a tiny camera (sigmoidoscope) is used to examine your rectum and lower colon. The sigmoidoscope is inserted through your anus into your rectum and lower colon. ? Colonoscopy. During this test, a long, thin, flexible tube with a tiny camera (colonoscope) is used to examine your entire colon and rectum.  Hepatitis C blood test.  Hepatitis B blood test.  Sexually transmitted disease (STD) testing.  Diabetes screening. This is done by checking your blood sugar (glucose) after you have not eaten for a while (fasting). You may have this done every 1-3 years.  Bone density scan. This is done to screen for osteoporosis. You may have this done starting at age 28.  Mammogram. This may be done every 1-2 years. Talk to your health care provider about how often you should have regular mammograms. Talk with your health care provider about your test results, treatment options, and if necessary, the need for more tests. Vaccines Your health care provider may recommend certain vaccines, such as:  Influenza vaccine.  This is recommended every year.  Tetanus, diphtheria, and acellular pertussis (Tdap, Td) vaccine. You may need a Td booster every 10 years.  Varicella vaccine. You may need this if you have not been vaccinated.  Zoster vaccine. You may need this after age 69.  Measles, mumps, and  rubella (MMR) vaccine. You may need at least one dose of MMR if you were born in 1957 or later. You may also need a second dose.  Pneumococcal 13-valent conjugate (PCV13) vaccine. One dose is recommended after age 42.  Pneumococcal polysaccharide (PPSV23) vaccine. One dose is recommended after age 85.  Meningococcal vaccine. You may need this if you have certain conditions.  Hepatitis A vaccine. You may need this if you have certain conditions or if you travel or work in places where you may be exposed to hepatitis A.  Hepatitis B vaccine. You may need this if you have certain conditions or if you travel or work in places where you may be exposed to hepatitis B.  Haemophilus influenzae type b (Hib) vaccine. You may need this if you have certain conditions. Talk to your health care provider about which screenings and vaccines you need and how often you need them. This information is not intended to replace advice given to you by your health care provider. Make sure you discuss any questions you have with your health care provider. Document Released: 10/27/2015 Document Revised: 11/20/2017 Document Reviewed: 08/01/2015 Elsevier Interactive Patient Education  2019 [employer].

## 2018-10-29 NOTE — Progress Notes (Signed)
Established Patient Office Visit     CC/Reason for Visit: Annual physical  HPI: Jill Graham is a 78 y.o. female who is coming in today for subsequent Medicare wellness visit as well as an annual physical exam.  Past Medical History is significant for: Type 2 diabetes that has been well controlled on metformin, hypertension previously well controlled on losartan and Ziac, depression with stable mood on Prozac, also has a history of left knee and right shoulder osteoarthritis.  History of hyperlipidemia on Zocor, depression with stable mood on Prozac, hypothyroidism on Synthroid, GERD on a PPI with well-controlled symptoms.  She gets routine dental care, has routine eye care through [facility], would like my recommendations for new ophthalmologist in [place], is up-to-date on all required immunizations.  We have discussed shingles vaccination today and she will go to her drugstore to have this done.  We have discussed age-appropriate cancer screening and how at the age of 24 she no longer needs routine mammograms, Pap smears or colonoscopies.  She has no acute complaints today.  Subsequent Medicare wellness visit   1. Risk factors, based on past  M,S,F -cardiovascular disease risk factors include hypertension, hyperlipidemia.   2.  Physical activities: She is not very physically active, she has a stationary bicycle at home and will use this for 10 to 15 minutes maybe twice a week.   3.  Depression/mood:  She has a history of depression, however her mood appears stable, no suicidal ideation.   4.  Hearing:  No issues   5.  ADL's: Independent with all ADLs   6.  Fall risk:  Low   7.  Home safety: No problems identified   8.  Height weight, and visual acuity: As documented in chart, stable, has visual acuity done yearly at her ophthalmologist   9.  Counseling:  We have counseled her today on continued weight loss, compliance with medications   10. Lab orders based on risk factors:  Laboratory update will be reviewed   11. Referral :  Ophthalmology and orthopedics per patient request   12. Care plan:  Continue efforts at aggressive risk factor modification   13. Cognitive assessment:  No cognitive impairment   14. Screening: Patient provided with a written and personalized 5-10 year screening schedule in the AVS.   yes   15. Provider List Update:   Other than PCP patient also sees ophthalmologist, orthopedist.    Past Medical/Surgical History: Past Medical History:  Diagnosis Date  . Arthritis   . Arthritis   . Back pain   . Depression   . Diabetes mellitus   . GERD (gastroesophageal reflux disease)   . Hypertension   . Knee pain     Past Surgical History:  Procedure Laterality Date  . CESAREAN SECTION  1975    Social History:  reports that she has quit smoking. She has never used smokeless tobacco. She reports current alcohol use. She reports that she does not use drugs.  Allergies: No Known Allergies  Family History:  Family History  Problem Relation Age of Onset  . High blood pressure Mother   . High Cholesterol Mother   . Stroke Mother   . Obesity Mother   . Diabetes Father   . Diabetes Maternal Aunt   . Diabetes Maternal Uncle   . Lung cancer Paternal Aunt      Current Outpatient Medications:  .  acyclovir (ZOVIRAX) 400 MG tablet, Take 1 tablet (400 mg total)  by mouth 2 (two) times daily., Disp: 30 tablet, Rfl: 1 .  aspirin EC 81 MG tablet, Take 81 mg by mouth daily., Disp: , Rfl:  .  bisoprolol-hydrochlorothiazide (ZIAC) 5-6.25 MG tablet, Take 1 tablet by mouth daily., Disp: 90 tablet, Rfl: 1 .  cholecalciferol (VITAMIN D) 1000 units tablet, Take 1,000 Units by mouth daily., Disp: , Rfl:  .  FLUoxetine (PROZAC) 20 MG capsule, Take 20 mg by mouth daily., Disp: , Rfl:  .  FLUoxetine (PROZAC) 40 MG capsule, Take 40 mg by mouth daily., Disp: , Rfl:  .  levothyroxine (SYNTHROID, LEVOTHROID) 25 MCG tablet, Take 1 tablet (25 mcg total)  by mouth daily., Disp: 90 tablet, Rfl: 1 .  losartan (COZAAR) 100 MG tablet, Take 1 tablet (100 mg total) by mouth daily., Disp: 90 tablet, Rfl: 1 .  metFORMIN (GLUCOPHAGE) 500 MG tablet, Take by mouth 2 (two) times daily with a meal., Disp: , Rfl:  .  Multiple Vitamins-Minerals (MULTIVITAMIN WITH MINERALS) tablet, Take 1 tablet by mouth daily., Disp: , Rfl:  .  Omega-3 Fatty Acids (FISH OIL) 1000 MG CAPS, Take by mouth., Disp: , Rfl:  .  omeprazole (PRILOSEC) 40 MG capsule, Take 40 mg by mouth daily., Disp: , Rfl:  .  simvastatin (ZOCOR) 20 MG tablet, Take 1 tablet (20 mg total) by mouth every evening., Disp: 90 tablet, Rfl: 1 .  traZODone (DESYREL) 50 MG tablet, Take 50 mg by mouth at bedtime., Disp: , Rfl:  .  vitamin B-12 (CYANOCOBALAMIN) 1000 MCG tablet, Take 1,000 mcg by mouth daily., Disp: , Rfl:   Review of Systems:  Constitutional: Denies fever, chills, diaphoresis, appetite change and fatigue.  HEENT: Denies photophobia, eye pain, redness, hearing loss, ear pain, congestion, sore throat, rhinorrhea, sneezing, mouth sores, trouble swallowing, neck pain, neck stiffness and tinnitus.   Respiratory: Denies SOB, DOE, cough, chest tightness,  and wheezing.   Cardiovascular: Denies chest pain, palpitations and leg swelling.  Gastrointestinal: Denies nausea, vomiting, abdominal pain, diarrhea, constipation, blood in stool and abdominal distention.  Genitourinary: Denies dysuria, urgency, frequency, hematuria, flank pain and difficulty urinating.  Endocrine: Denies: hot or cold intolerance, sweats, changes in hair or nails, polyuria, polydipsia. Musculoskeletal: Denies myalgias, back pain, joint swelling, arthralgias and gait problem.  Skin: Denies pallor, rash and wound.  Neurological: Denies dizziness, seizures, syncope, weakness, light-headedness, numbness and headaches.  Hematological: Denies adenopathy. Easy bruising, personal or family bleeding history  Psychiatric/Behavioral: Denies  suicidal ideation, mood changes, confusion, nervousness, sleep disturbance and agitation    Physical Exam: Vitals:   10/29/18 0959  BP: 130/80  Pulse: 69  Temp: 99 F (37.2 C)  TempSrc: Oral  SpO2: 94%  Weight: 200 lb 14.4 oz (91.1 kg)  Height: _0  (1.651 m)    Body mass index is 33.43 kg/m.   Constitutional: NAD, calm, comfortable Eyes: PERRL, lids and conjunctivae normal ENMT: Mucous membranes are moist. Posterior pharynx clear of any exudate or lesions. Normal dentition. Tympanic membrane is pearly white, no erythema or bulging. Neck: normal, supple, no masses, no thyromegaly Respiratory: clear to auscultation bilaterally, no wheezing, no crackles. Normal respiratory effort. No accessory muscle use.  Cardiovascular: Regular rate and rhythm, no murmurs / rubs / gallops. No extremity edema. 2+ pedal pulses. No carotid bruits.  Abdomen: no tenderness, no masses palpated. No hepatosplenomegaly. Bowel sounds positive.  Musculoskeletal: no clubbing / cyanosis. No joint deformity upper and lower extremities. Good ROM, no contractures. Normal muscle tone.  Skin: no rashes, lesions, ulcers.  No induration Neurologic: CN 2-12 grossly intact. Sensation intact, DTR normal. Strength 5/5 in all 4.  Psychiatric: Normal judgment and insight. Alert and oriented x 3. Normal mood.    Impression and Plan:  Encounter for preventive health examination -We have discussed age-appropriate cancer screening, immunizations, she will get shingles vaccination at her drugstore.  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  Depression, recurrent ([place]) -Stable, on Prozac 20 mg daily  Gastroesophageal reflux disease without esophagitis -Stable, no symptoms, on Prilosec 40 mg daily  Essential hypertension  -Well-controlled on current medications.  Other specified hypothyroidism -On  Synthroid, for TSH today.  Type 2 diabetes mellitus without complication, without long-term current use of insulin ([place])  -Very well controlled with a recent A1c of 5.2 in December. -To continue metformin, she was taking this 3 times a day, given her A1c have advised she may cut back down to twice daily. -Continue to encourage lifestyle modifications including continued weight loss.  Screening for osteoporosis  -DEXA scan and vitamin D levels will be requested today.  Herpes dermatitis  -She takes acyclovir for prevention, she has been taking it 3 times a day, and advised that she may cut down to once daily.    Patient Instructions  -It was nice seeing you today!  -Lab work today. We will notify you when results are available.  -Referrals to eye doctor and orthopedics have been requested.  -Take metformin twice a day.  -Take acyclovir once daily for prevention.  -Schedule follow up in 3 months. No need to come fasting to that visit.   Preventive Care 44 Years and Older, Female Preventive care refers to lifestyle choices and visits with your health care provider that can promote health and wellness. What does preventive care include?  A yearly physical exam. This is also called an annual well check.  Dental exams once or twice a year.  Routine eye exams. Ask your health care provider how often you should have your eyes checked.  Personal lifestyle choices, including: ? Daily care of your teeth and gums. ? Regular physical activity. ? Eating a healthy diet. ? Avoiding tobacco and drug use. ? Limiting alcohol use. ? Practicing safe sex. ? Taking low-dose aspirin every day. ? Taking vitamin and mineral supplements as recommended by your health care provider. What happens during an annual well check? The services and screenings done by your health care provider during your annual well check will depend on your age, overall health, lifestyle risk factors, and family history  of disease. Counseling Your health care provider may ask you questions about your:  Alcohol use.  Tobacco use.  Drug use.  Emotional well-being.  Home and relationship well-being.  Sexual activity.  Eating habits.  History of falls.  Memory and ability to understand (cognition).  Work and work Statistician.  Reproductive health.  Screening You may have the following tests or measurements:  Height, weight, and BMI.  Blood pressure.  Lipid and cholesterol levels. These may be checked every 5 years, or more frequently if you are over 62 years old.  Skin check.  Lung cancer screening. You may have this screening every year starting at age 62 if you have a 30-pack-year history of smoking and currently smoke or have quit within the past 15 years.  Colorectal cancer screening. All adults should have this screening starting at age 60 and continuing until age  72. You will have tests every 1-10 years, depending on your results and the type of screening test. People at increased risk should start screening at an earlier age. Screening tests may include: ? Guaiac-based fecal occult blood testing. ? Fecal immunochemical test (FIT). ? Stool DNA test. ? Virtual colonoscopy. ? Sigmoidoscopy. During this test, a flexible tube with a tiny camera (sigmoidoscope) is used to examine your rectum and lower colon. The sigmoidoscope is inserted through your anus into your rectum and lower colon. ? Colonoscopy. During this test, a long, thin, flexible tube with a tiny camera (colonoscope) is used to examine your entire colon and rectum.  Hepatitis C blood test.  Hepatitis B blood test.  Sexually transmitted disease (STD) testing.  Diabetes screening. This is done by checking your blood sugar (glucose) after you have not eaten for a while (fasting). You may have this done every 1-3 years.  Bone density scan. This is done to screen for osteoporosis. You may have this done starting at age  59.  Mammogram. This may be done every 1-2 years. Talk to your health care provider about how often you should have regular mammograms. Talk with your health care provider about your test results, treatment options, and if necessary, the need for more tests. Vaccines Your health care provider may recommend certain vaccines, such as:  Influenza vaccine. This is recommended every year.  Tetanus, diphtheria, and acellular pertussis (Tdap, Td) vaccine. You may need a Td booster every 10 years.  Varicella vaccine. You may need this if you have not been vaccinated.  Zoster vaccine. You may need this after age 77.  Measles, mumps, and rubella (MMR) vaccine. You may need at least one dose of MMR if you were born in 1957 or later. You may also need a second dose.  Pneumococcal 13-valent conjugate (PCV13) vaccine. One dose is recommended after age 43.  Pneumococcal polysaccharide (PPSV23) vaccine. One dose is recommended after age 6.  Meningococcal vaccine. You may need this if you have certain conditions.  Hepatitis A vaccine. You may need this if you have certain conditions or if you travel or work in places where you may be exposed to hepatitis A.  Hepatitis B vaccine. You may need this if you have certain conditions or if you travel or work in places where you may be exposed to hepatitis B.  Haemophilus influenzae type b (Hib) vaccine. You may need this if you have certain conditions. Talk to your health care provider about which screenings and vaccines you need and how often you need them. This information is not intended to replace advice given to you by your health care provider. Make sure you discuss any questions you have with your health care provider. Document Released: 10/27/2015 Document Revised: 11/20/2017 Document Reviewed: 08/01/2015 Elsevier Interactive Patient Education  2019 [place], MD [facility]

## 2018-10-30 ENCOUNTER — Encounter: Payer: Self-pay | Admitting: Internal Medicine

## 2018-11-03 ENCOUNTER — Other Ambulatory Visit: Payer: Self-pay | Admitting: Internal Medicine

## 2018-11-03 DIAGNOSIS — Z1239 Encounter for other screening for malignant neoplasm of breast: Secondary | ICD-10-CM

## 2018-11-04 ENCOUNTER — Encounter (INDEPENDENT_AMBULATORY_CARE_PROVIDER_SITE_OTHER): Payer: Self-pay | Admitting: Orthopaedic Surgery

## 2018-11-04 ENCOUNTER — Ambulatory Visit (INDEPENDENT_AMBULATORY_CARE_PROVIDER_SITE_OTHER): Payer: Medicare Other | Admitting: Orthopaedic Surgery

## 2018-11-04 ENCOUNTER — Ambulatory Visit (INDEPENDENT_AMBULATORY_CARE_PROVIDER_SITE_OTHER): Payer: Self-pay

## 2018-11-04 DIAGNOSIS — M25511 Pain in right shoulder: Secondary | ICD-10-CM | POA: Diagnosis not present

## 2018-11-04 DIAGNOSIS — M25562 Pain in left knee: Secondary | ICD-10-CM | POA: Diagnosis not present

## 2018-11-04 MED ORDER — METHYLPREDNISOLONE ACETATE 40 MG/ML IJ SUSP
40.0000 mg | INTRAMUSCULAR | Status: AC | PRN
  Administered 2018-11-04: 40 mg via INTRA_ARTICULAR

## 2018-11-04 MED ORDER — LIDOCAINE HCL 1 % IJ SOLN
3.0000 mL | INTRAMUSCULAR | Status: AC | PRN
  Administered 2018-11-04: 3 mL

## 2018-11-04 NOTE — Progress Notes (Signed)
Office Visit Note   Patient: Jill Graham           Date of Birth: Mar 06, 1941           MRN: [id number] Visit Date: 11/04/2018              Requested by: Philip Aspen, Limmie Patricia, MD [address] [place], [place] [address] PCP: Philip Aspen, Limmie Patricia, MD   Assessment & Plan: Visit Diagnoses:  1. Left knee pain, unspecified chronicity   2. Right shoulder pain, unspecified chronicity     Plan: Although she is a diabetic her hemoglobin A1c is in the 5 range.  She did wish to have a steroid injection in both her right shoulder and left knee today and she tolerated these well.  She is a perfect candidate for hyaluronic acid for the left knee which we will order for her and I gave her handout about this.  I do feel that she would benefit from an intra-articular glenohumeral joint injection of the right shoulder will address that we see her in a month since she is already had 2 steroid injections in her body today.  I counseled her about how this can affect her blood glucose issues and watch her closely.  All question concerns were answered and addressed.  Follow-Up Instructions: Return in about 4 weeks (around 12/02/2018).   Orders:  Orders Placed This Encounter  Procedures  . Large Joint Inj  . Large Joint Inj  . XR Shoulder Right  . XR Knee 1-2 Views Left   No orders of the defined types were placed in this encounter.     Procedures: Large Joint Inj: L knee on 11/04/2018 3:09 PM Indications: diagnostic evaluation and pain Details: 22 G 1.5 in needle, superolateral approach  Arthrogram: No  Medications: 3 mL lidocaine 1 %; 40 mg methylPREDNISolone acetate 40 MG/ML Outcome: tolerated well, no immediate complications Procedure, treatment alternatives, risks and benefits explained, specific risks discussed. Consent was given by the patient. Immediately prior to procedure a time out was called to verify the correct patient, procedure, equipment, support staff and  site/side marked as required. Patient was prepped and draped in the usual sterile fashion.   Large Joint Inj: R subacromial bursa on 11/04/2018 3:10 PM Indications: pain and diagnostic evaluation Details: 22 G 1.5 in needle  Arthrogram: No  Medications: 3 mL lidocaine 1 %; 40 mg methylPREDNISolone acetate 40 MG/ML Outcome: tolerated well, no immediate complications Procedure, treatment alternatives, risks and benefits explained, specific risks discussed. Consent was given by the patient. Immediately prior to procedure a time out was called to verify the correct patient, procedure, equipment, support staff and site/side marked as required. Patient was prepped and draped in the usual sterile fashion.       Clinical Data: No additional findings.   Subjective: Chief Complaint  Patient presents with  . Right Shoulder - Pain  . Left Knee - Pain  The patient is a very pleasant and active 77 year old who comes in with a chief complaint of left knee pain and swelling although slight swelling today is been a chronic issue for her.  She is also had a lot of problems with her right shoulder.  It pops quite a bit and it is painful to move.  It is detrimentally affecting her actives daily living in terms of overhead activities and reaching behind her.  She denies any injury to her shoulder or knee.  The shoulder pain is causing some tightness  in her neck in the parascapular area as well on the right side.  She denies any numbness and tingling in her hands.  She is never had surgery on her shoulder or her knee.  She has had a steroid injection remotely in the left knee that did help.  Is been a while.  HPI  Review of Systems She currently denies any headache, chest pain, shortness of breath, fever, chills, nausea, vomiting  Objective: Vital Signs: There were no vitals taken for this visit.  Physical Exam She is alert and orient x3 and in no acute distress Ortho Exam Examination of her right  shoulder does show grinding at the glenohumeral joint.  She has limited overhead motion with some weakness of the rotator cuff.  There is signs of impingement as well.  She has a lot of pain at the [facility] joint.  Examination of her left knee shows full range of motion with some slight patellofemoral crepitation.  There is no effusion today and no warmth.  The knee feels ligamentously stable. Specialty Comments:  No specialty comments available.  Imaging: Xr Knee 1-2 Views Left  Result Date: 11/04/2018 2 views of the left knee show no acute findings.  The medial lateral compartments are well maintained with slight medial compartment narrowing.  There is significant patellofemoral arthritic changes.  Xr Shoulder Right  Result Date: 11/04/2018 3 views of the right shoulder show significant glenohumeral arthritic changes with joint space narrowing and particular osteophytes.  There is also severe AC joint arthritic changes.    PMFS History: Patient Active Problem List   Diagnosis Date Noted  . Osteoarthritis of left knee 09/22/2018  . Depression, recurrent (H[facility]) 09/15/2018  . GERD (gastroesophageal reflux disease) 09/15/2018  . Other fatigue 06/10/2018  . Shortness of breath on exertion 06/10/2018  . Essential hypertension 06/10/2018  . Other specified hypothyroidism 06/10/2018  . Type 2 diabetes mellitus without complication, without long-term current use of insulin ([facility]) 06/10/2018   Past Medical History:  Diagnosis Date  . Arthritis   . Arthritis   . Back pain   . Depression   . Diabetes mellitus   . GERD (gastroesophageal reflux disease)   . Hypertension   . Knee pain     Family History  Problem Relation Age of Onset  . High blood pressure Mother   . High Cholesterol Mother   . Stroke Mother   . Obesity Mother   . Diabetes Father   . Diabetes Maternal Aunt   . Diabetes Maternal Uncle   . Lung cancer Paternal Aunt     Past Surgical History:  Procedure Laterality Date  .  CESAREAN SECTION  1975   Social History   Occupational History  . Occupation: Retired  Tobacco Use  . Smoking status: Former Games developer  . Smokeless tobacco: Never Used  Substance and Sexual Activity  . Alcohol use: Yes  . Drug use: No  . Sexual activity: Never

## 2018-11-05 ENCOUNTER — Encounter (INDEPENDENT_AMBULATORY_CARE_PROVIDER_SITE_OTHER): Payer: Self-pay | Admitting: Orthopaedic Surgery

## 2018-11-05 ENCOUNTER — Telehealth (INDEPENDENT_AMBULATORY_CARE_PROVIDER_SITE_OTHER): Payer: Self-pay

## 2018-11-05 NOTE — Telephone Encounter (Signed)
Left knee gel injection ?

## 2018-11-05 NOTE — Telephone Encounter (Signed)
Noted  

## 2018-11-17 ENCOUNTER — Telehealth (INDEPENDENT_AMBULATORY_CARE_PROVIDER_SITE_OTHER): Payer: Self-pay

## 2018-11-17 NOTE — Telephone Encounter (Signed)
Submitted VOB for SynviscOne, left knee. 

## 2018-11-24 ENCOUNTER — Telehealth (INDEPENDENT_AMBULATORY_CARE_PROVIDER_SITE_OTHER): Payer: Self-pay

## 2018-11-24 NOTE — Telephone Encounter (Signed)
Patient is Approved for SynviscOne, left knee. Buy & Bill Patient will be responsible for 20% OOP. Co-pay of $30.00 No PA required  Appt. 12/09/2018 with Dr. Magnus Ivan

## 2018-12-02 ENCOUNTER — Ambulatory Visit (INDEPENDENT_AMBULATORY_CARE_PROVIDER_SITE_OTHER): Payer: Medicare Other | Admitting: Orthopaedic Surgery

## 2018-12-09 ENCOUNTER — Encounter (INDEPENDENT_AMBULATORY_CARE_PROVIDER_SITE_OTHER): Payer: Self-pay | Admitting: Orthopaedic Surgery

## 2018-12-09 ENCOUNTER — Ambulatory Visit (INDEPENDENT_AMBULATORY_CARE_PROVIDER_SITE_OTHER): Payer: Medicare Other | Admitting: Orthopaedic Surgery

## 2018-12-09 DIAGNOSIS — M25562 Pain in left knee: Secondary | ICD-10-CM

## 2018-12-09 DIAGNOSIS — M25511 Pain in right shoulder: Secondary | ICD-10-CM | POA: Diagnosis not present

## 2018-12-09 NOTE — Addendum Note (Signed)
Addended by: Cherre Huger E on: 12/09/2018 04:20 PM   Modules accepted: Orders

## 2018-12-09 NOTE — Progress Notes (Signed)
The patient is here today having follow-up after a Synvisc 1 injection in her left knee as well as steroid injection in her right shoulder.  She says both areas are doing much better however the shoulder still quite painful to her.  Unfortunate x-rays the shoulder do show significant glenohumeral arthritic changes.  On exam the left knee is moving much better and overall has no effusion and feels ligamentously stable.  She is very pleased with this.  The right shoulder does move well and it has improved in terms of his motion since I saw her last and the subacromial injection I provided to her has helped some with the pain.  She does have grinding at the beginning of the joint.  She is not a diabetic.  At this point I do feel that she benefit from a one-time intra-articular steroid injection under fluoroscopy by Dr. Alvester Morin in her right shoulder joint.  She agrees with trying this as well.  From my standpoint she will follow-up as needed.  I think the next up would be considering a shoulder replacement if the injections do not work but I do feel that this may help by her a long period of time before considering the type of surgery.  We will call her to set up this appointment with Dr. Alvester Morin.

## 2018-12-24 ENCOUNTER — Encounter (INDEPENDENT_AMBULATORY_CARE_PROVIDER_SITE_OTHER): Payer: Self-pay | Admitting: Physical Medicine and Rehabilitation

## 2018-12-28 ENCOUNTER — Ambulatory Visit (INDEPENDENT_AMBULATORY_CARE_PROVIDER_SITE_OTHER): Payer: Self-pay | Admitting: Physical Medicine and Rehabilitation

## 2018-12-30 ENCOUNTER — Encounter (INDEPENDENT_AMBULATORY_CARE_PROVIDER_SITE_OTHER): Payer: Medicare Other | Admitting: Ophthalmology

## 2019-01-07 ENCOUNTER — Other Ambulatory Visit: Payer: Self-pay | Admitting: Internal Medicine

## 2019-01-07 DIAGNOSIS — B0089 Other herpesviral infection: Secondary | ICD-10-CM

## 2019-01-07 MED ORDER — ACYCLOVIR 400 MG PO TABS: 400.0000 mg | ORAL_TABLET | Freq: Two times a day (BID) | ORAL | 1 refills | Status: DC

## 2019-01-28 ENCOUNTER — Other Ambulatory Visit: Payer: Self-pay

## 2019-01-28 ENCOUNTER — Encounter: Payer: Self-pay | Admitting: Internal Medicine

## 2019-01-28 ENCOUNTER — Ambulatory Visit (INDEPENDENT_AMBULATORY_CARE_PROVIDER_SITE_OTHER): Payer: Medicare Other | Admitting: Internal Medicine

## 2019-01-28 DIAGNOSIS — F339 Major depressive disorder, recurrent, unspecified: Secondary | ICD-10-CM

## 2019-01-28 DIAGNOSIS — E119 Type 2 diabetes mellitus without complications: Secondary | ICD-10-CM

## 2019-01-28 DIAGNOSIS — G4700 Insomnia, unspecified: Secondary | ICD-10-CM | POA: Insufficient documentation

## 2019-01-28 MED ORDER — TRAZODONE HCL 50 MG PO TABS: 50.0000 mg | ORAL_TABLET | Freq: Every day | ORAL | 1 refills | Status: DC

## 2019-01-28 MED ORDER — FLUOXETINE HCL 20 MG PO CAPS: 20.0000 mg | ORAL_CAPSULE | Freq: Every day | ORAL | 1 refills | Status: DC

## 2019-01-28 MED ORDER — FLUOXETINE HCL 40 MG PO CAPS: 40.0000 mg | ORAL_CAPSULE | Freq: Every day | ORAL | 1 refills | Status: DC

## 2019-01-28 NOTE — Progress Notes (Signed)
Virtual Visit via Video Note  I connected with Jill Graham on 01/28/19 at 11:00 AM EDT by a video enabled telemedicine application and verified that I am speaking with the correct person using two identifiers.  Location patient: home Location provider: work office Persons participating in the virtual visit: patient, provider  I discussed the limitations of evaluation and management by telemedicine and the availability of in person appointments. The patient expressed understanding and agreed to proceed.   HPI: She has scheduled this visit due to concerns with her diabetes. She also needs medication refills.  She is usually very well controlled with CBGs in the 90-115 range. Her A1c in 1/20 was 5.2. She takes metformin 500 mg BID. For the past month, due to the COVID-19 pandemic, she has been at home more, more sedentary and admits that her eating has been "out of control". Her FBS have been in the range of 140-160. She self-titrated her metformin up to 500 mg q am and 1000 mg q pm.  She also needs her trazodone and fluoxetine called into her mail order pharmacy.   ROS: Constitutional: Denies fever, chills, diaphoresis, appetite change and fatigue.  HEENT: Denies photophobia, eye pain, redness, hearing loss, ear pain, congestion, sore throat, rhinorrhea, sneezing, mouth sores, trouble swallowing, neck pain, neck stiffness and tinnitus.   Respiratory: Denies SOB, DOE, cough, chest tightness,  and wheezing.   Cardiovascular: Denies chest pain, palpitations and leg swelling.  Gastrointestinal: Denies nausea, vomiting, abdominal pain, diarrhea, constipation, blood in stool and abdominal distention.  Genitourinary: Denies dysuria, urgency, frequency, hematuria, flank pain and difficulty urinating.  Endocrine: Denies: hot or cold intolerance, sweats, changes in hair or nails, polyuria, polydipsia. Musculoskeletal: Denies myalgias, back pain, joint swelling, arthralgias and gait problem.   Skin: Denies pallor, rash and wound.  Neurological: Denies dizziness, seizures, syncope, weakness, light-headedness, numbness and headaches.  Hematological: Denies adenopathy. Easy bruising, personal or family bleeding history  Psychiatric/Behavioral: Denies suicidal ideation, mood changes, confusion, nervousness, sleep disturbance and agitation   Past Medical History:  Diagnosis Date  . Arthritis   . Arthritis   . Back pain   . Depression   . Diabetes mellitus   . GERD (gastroesophageal reflux disease)   . Hypertension   . Knee pain     Past Surgical History:  Procedure Laterality Date  . CESAREAN SECTION  1975    Family History  Problem Relation Age of Onset  . High blood pressure Mother   . High Cholesterol Mother   . Stroke Mother   . Obesity Mother   . Diabetes Father   . Diabetes Maternal Aunt   . Diabetes Maternal Uncle   . Lung cancer Paternal Aunt     SOCIAL HX:   reports that she has quit smoking. She has never used smokeless tobacco. She reports current alcohol use. She reports that she does not use drugs.   Current Outpatient Medications:  .  acyclovir (ZOVIRAX) 400 MG tablet, Take 1 tablet (400 mg total) by mouth 2 (two) times daily., Disp: 180 tablet, Rfl: 1 .  aspirin EC 81 MG tablet, Take 81 mg by mouth daily., Disp: , Rfl:  .  bisoprolol-hydrochlorothiazide (ZIAC) 5-6.25 MG tablet, Take 1 tablet by mouth daily., Disp: 90 tablet, Rfl: 1 .  cholecalciferol (VITAMIN D) 1000 units tablet, Take 1,000 Units by mouth daily., Disp: , Rfl:  .  FLUoxetine (PROZAC) 20 MG capsule, Take 20 mg by mouth daily., Disp: , Rfl:  .  FLUoxetine (PROZAC) 40 MG capsule, Take 40 mg by mouth daily., Disp: , Rfl:  .  levothyroxine (SYNTHROID, LEVOTHROID) 25 MCG tablet, Take 1 tablet (25 mcg total) by mouth daily., Disp: 90 tablet, Rfl: 1 .  losartan (COZAAR) 100 MG tablet, Take 1 tablet (100 mg total) by mouth daily., Disp: 90 tablet, Rfl: 1 .  metFORMIN (GLUCOPHAGE) 500 MG  tablet, Take by mouth 2 (two) times daily with a meal., Disp: , Rfl:  .  Multiple Vitamins-Minerals (MULTIVITAMIN WITH MINERALS) tablet, Take 1 tablet by mouth daily., Disp: , Rfl:  .  Omega-3 Fatty Acids (FISH OIL) 1000 MG CAPS, Take by mouth., Disp: , Rfl:  .  omeprazole (PRILOSEC) 40 MG capsule, Take 40 mg by mouth daily., Disp: , Rfl:  .  simvastatin (ZOCOR) 20 MG tablet, Take 1 tablet (20 mg total) by mouth every evening., Disp: 90 tablet, Rfl: 1 .  traZODone (DESYREL) 50 MG tablet, Take 50 mg by mouth at bedtime., Disp: , Rfl:  .  vitamin B-12 (CYANOCOBALAMIN) 1000 MCG tablet, Take 1,000 mcg by mouth daily., Disp: , Rfl:   EXAM:   VITALS per patient if applicable: none reported  GENERAL: alert, oriented, appears well and in no acute distress  HEENT: atraumatic, conjunttiva clear, no obvious abnormalities on inspection of external nose and ears  NECK: normal movements of the head and neck  LUNGS: on inspection no signs of respiratory distress, breathing rate appears normal, no obvious gross increased work of breathing, gasping or wheezing  CV: no obvious cyanosis  MS: moves all visible extremities without noticeable abnormality  PSYCH/NEURO: pleasant and cooperative, no obvious depression or anxiety, speech and thought processing grossly intact  ASSESSMENT AND PLAN:   Type 2 diabetes mellitus without complication, without long-term current use of insulin (HCC) -She has noticed increasing fasting blood sugars due to diet non-compliance. -Agree with increasing metformin to 500 q am and 1000 q pm. -She will continue to monitor CBGs and work on diet compliance. -A1c at next in person office visit.  Depression, recurrent (HCC) -She takes prozac 60 mg daily. -Will send in Rx.  Insomnia, unspecified type -Will send in Rx for trazodone.     I discussed the assessment and treatment plan with the patient. The patient was provided an opportunity to ask questions and all were  answered. The patient agreed with the plan and demonstrated an understanding of the instructions.   The patient was advised to call back or seek an in-person evaluation if the symptoms worsen or if the condition fails to improve as anticipated.    Chaya Jan, MD  [facility]

## 2019-01-29 ENCOUNTER — Ambulatory Visit: Payer: Medicare Other | Admitting: Internal Medicine

## 2019-03-01 ENCOUNTER — Encounter: Payer: Self-pay | Admitting: Orthopaedic Surgery

## 2019-03-02 ENCOUNTER — Encounter: Payer: Self-pay | Admitting: Internal Medicine

## 2019-03-02 ENCOUNTER — Encounter: Payer: Self-pay | Admitting: Orthopaedic Surgery

## 2019-03-03 ENCOUNTER — Encounter: Payer: Self-pay | Admitting: Internal Medicine

## 2019-03-03 DIAGNOSIS — E119 Type 2 diabetes mellitus without complications: Secondary | ICD-10-CM

## 2019-03-16 ENCOUNTER — Ambulatory Visit: Payer: Self-pay

## 2019-03-16 ENCOUNTER — Ambulatory Visit (INDEPENDENT_AMBULATORY_CARE_PROVIDER_SITE_OTHER): Payer: Medicare Other | Admitting: Physical Medicine and Rehabilitation

## 2019-03-16 ENCOUNTER — Other Ambulatory Visit: Payer: Self-pay

## 2019-03-16 ENCOUNTER — Encounter: Payer: Self-pay | Admitting: Physical Medicine and Rehabilitation

## 2019-03-16 DIAGNOSIS — M25511 Pain in right shoulder: Secondary | ICD-10-CM

## 2019-03-16 DIAGNOSIS — G8929 Other chronic pain: Secondary | ICD-10-CM | POA: Diagnosis not present

## 2019-03-16 MED ORDER — BUPIVACAINE HCL 0.25 % IJ SOLN
4.0000 mL | INTRAMUSCULAR | Status: AC | PRN
  Administered 2019-03-16: 4 mL via INTRA_ARTICULAR

## 2019-03-16 MED ORDER — TRIAMCINOLONE ACETONIDE 40 MG/ML IJ SUSP
80.0000 mg | INTRAMUSCULAR | Status: AC | PRN
  Administered 2019-03-16: 80 mg via INTRA_ARTICULAR

## 2019-03-16 NOTE — Progress Notes (Signed)
[facility] - Clinic Note  03/18/2019     CHIEF COMPLAINT Patient presents for Retina Evaluation and Diabetic Eye Exam   HISTORY OF PRESENT ILLNESS: Jill Graham is a 78 y.o. female who presents to the clinic today for:   HPI    Retina Evaluation    In both eyes.  This started 1 year ago.  Associated Symptoms Floaters.  Negative for Flashes, Blind Spot, Photophobia, Scalp Tenderness, Fever, Weight Loss, Jaw Claudication, Glare, Pain, Distortion, Redness, Trauma, Shoulder/Hip pain and Fatigue.  Context:  distance vision, near vision and mid-range vision.  Treatments tried include no treatments.  I, the attending physician,  performed the HPI with the patient and updated documentation appropriately.          Diabetic Eye Exam    Vision is stable.  Associated Symptoms Negative for Flashes, Blind Spot, Photophobia, Scalp Tenderness, Fever, Weight Loss, Jaw Claudication, Glare, Pain, Floaters, Distortion, Trauma, Redness, Shoulder/Hip pain and Fatigue.  Diabetes characteristics include Type 2.  This started 25 years ago.  Blood sugar level is controlled.  Last Blood Glucose 138.  Last A1C 5.1.  I, the attending physician,  performed the HPI with the patient and updated documentation appropriately.          Comments    Referral of Dr. Ardyth Harps for DM exam. Patient states she is DM 2 x 25 years, BS 138(03/16/2019), A1C 5.1 (08/2018), she is taking Metformin and BS are controlled. Pt states she has occasional floaters ou, denies flashes , light sensitivity and ocular pain.         Last edited by Posey Boyer, COT on 03/18/2019  8:46 AM. (History)    pt states her PCP, Dr. Ardyth Harps, sent her here for a DM exam, pt states she used to see Dr. Arnetha Gula in [facility], but wanted a dr in [place], pt states she has been diabetic for about 25 years, and takes metformin, she states her blood sugar is under control, pt states she takes medication for blood pressure also      Referring physician: Philip Aspen, Limmie Patricia, MD [address] [place], [place] [address]  HISTORICAL INFORMATION:   Selected notes from the MEDICAL RECORD NUMBER Referred by Dr. Limmie Patricia. Philip Aspen for DM exam LEE:  Ocular Hx- PMH-DM (A1c: 5.2 [01.20], take metformin, arthritis, HTN, depression   CURRENT MEDICATIONS: No current outpatient medications on file. (Ophthalmic Drugs)   No current facility-administered medications for this visit.  (Ophthalmic Drugs)   Current Outpatient Medications (Other)  Medication Sig  . acyclovir (ZOVIRAX) 400 MG tablet Take 1 tablet (400 mg total) by mouth 2 (two) times daily.  Marland Kitchen aspirin EC 81 MG tablet Take 81 mg by mouth daily.  . bisoprolol-hydrochlorothiazide (ZIAC) 5-6.25 MG tablet Take 1 tablet by mouth daily.  . cholecalciferol (VITAMIN D) 1000 units tablet Take 1,000 Units by mouth daily.  Marland Kitchen FLUoxetine (PROZAC) 20 MG capsule Take 1 capsule (20 mg total) by mouth daily.  Marland Kitchen FLUoxetine (PROZAC) 40 MG capsule Take 1 capsule (40 mg total) by mouth daily.  Marland Kitchen levothyroxine (SYNTHROID, LEVOTHROID) 25 MCG tablet Take 1 tablet (25 mcg total) by mouth daily.  Marland Kitchen losartan (COZAAR) 100 MG tablet Take 1 tablet (100 mg total) by mouth daily.  . metFORMIN (GLUCOPHAGE) 500 MG tablet Take by mouth 2 (two) times daily with a meal.  . Multiple Vitamins-Minerals (MULTIVITAMIN WITH MINERALS) tablet Take 1 tablet by mouth daily.  . Omega-3 Fatty  Acids (FISH OIL) 1000 MG CAPS Take by mouth.  Marland Kitchen omeprazole (PRILOSEC) 40 MG capsule Take 40 mg by mouth daily.  . simvastatin (ZOCOR) 20 MG tablet Take 1 tablet (20 mg total) by mouth every evening.  . traZODone (DESYREL) 50 MG tablet Take 1 tablet (50 mg total) by mouth at bedtime.  . vitamin B-12 (CYANOCOBALAMIN) 1000 MCG tablet Take 1,000 mcg by mouth daily.   No current facility-administered medications for this visit.  (Other)      REVIEW OF SYSTEMS: ROS    Positive for: Endocrine, Eyes    Negative for: Constitutional, Gastrointestinal, Neurological, Skin, Genitourinary, Musculoskeletal, HENT, Cardiovascular, Respiratory, Psychiatric, Allergic/Imm, Heme/Lymph   Last edited by Eldridge Scot, LPN on 02/20/4584  8:44 AM. (History)       ALLERGIES No Known Allergies  PAST MEDICAL HISTORY Past Medical History:  Diagnosis Date  . Arthritis   . Arthritis   . Back pain   . Depression   . Diabetes mellitus   . GERD (gastroesophageal reflux disease)   . Hypertension   . Knee pain    Past Surgical History:  Procedure Laterality Date  . CESAREAN SECTION  1975    FAMILY HISTORY Family History  Problem Relation Age of Onset  . High blood pressure Mother   . High Cholesterol Mother   . Stroke Mother   . Obesity Mother   . Diabetes Father   . Diabetes Maternal Aunt   . Diabetes Maternal Uncle   . Lung cancer Paternal Aunt     SOCIAL HISTORY Social History   Tobacco Use  . Smoking status: Former Games developer  . Smokeless tobacco: Never Used  Substance Use Topics  . Alcohol use: Yes  . Drug use: No         OPHTHALMIC EXAM:  Base Eye Exam    Visual Acuity (Snellen - Linear)      Right Left   Dist cc 20/25 +2 20/25   Dist ph cc NI 20/20 -2   Correction:  Glasses       Tonometry (Tonopen, 8:37 AM)      Right Left   Pressure 11 12       Pupils      Dark Light Shape React APD   Right 4 3 Round Brisk None   Left 4 3 Round Brisk None       Visual Fields      Left Right    Full Full       Extraocular Movement      Right Left    Full, Ortho Full, Ortho       Neuro/Psych    Oriented x3:  Yes       Dilation    Both eyes:  1.0% Mydriacyl, 2.5% Phenylephrine @ 8:30 AM        Slit Lamp and Fundus Exam    Slit Lamp Exam      Right Left   Lids/Lashes Dermatochalasis - upper lid Dermatochalasis - upper lid   Conjunctiva/Sclera trace nasal Pinguecula trace nasal Pinguecula   Cornea mild Arcus mild Arcus   Anterior Chamber Deep and quiet Deep  and quiet   Iris Round and dilated, No NVI Round and dilated, No NVI   Lens 2+ Nuclear sclerosis, 2-3+ Cortical cataract 2+ Nuclear sclerosis, 2-3+ Cortical cataract   Vitreous Vitreous syneresis, Posterior vitreous detachment Vitreous syneresis, Posterior vitreous detachment       Fundus Exam      Right Left   Disc  Pink and Sharp Pink and Sharp   C/D Ratio 0.5 0.4   Macula Good foveal reflex, mild Retinal pigment epithelial mottling, No heme or edema Good foveal reflex, mild Retinal pigment epithelial mottling, No heme or edema   Vessels mild Vascular attenuation mild Vascular attenuation   Periphery Attached, patch of pigmented pavingstone from 0430-0730 Attached, patch of pigmented pavingstone from 0300-0700          IMAGING AND PROCEDURES  Imaging and Procedures for @TODAY @  OCT, Retina - OU - Both Eyes       Right Eye Quality was good. Central Foveal Thickness: 289. Progression has no prior data. Findings include normal foveal contour, no SRF, no IRF.   Left Eye Quality was good. Central Foveal Thickness: 286. Progression has no prior data. Findings include normal foveal contour, no IRF, no SRF.   Notes *Images captured and stored on drive  Diagnosis / Impression:  NFP, no IRF/SRF OU No DME OU   Clinical management:  See below  Abbreviations: NFP - Normal foveal profile. CME - cystoid macular edema. PED - pigment epithelial detachment. IRF - intraretinal fluid. SRF - subretinal fluid. EZ - ellipsoid zone. ERM - epiretinal membrane. ORA - outer retinal atrophy. ORT - outer retinal tubulation. SRHM - subretinal hyper-reflective material                 ASSESSMENT/PLAN:    ICD-10-CM   1. Diabetes mellitus type 2 without retinopathy (HCC) E11.9   2. Retinal edema H35.81 OCT, Retina - OU - Both Eyes  3. Essential hypertension I10   4. 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 Combined forms of age-related cataract of both eyes H25.813     1,2.  Diabetes mellitus, type 2 without retinopathy  - The incidence, risk factors for progression, natural history and treatment options for diabetic retinopathy  were discussed with patient.    - The need for close monitoring of blood glucose, blood pressure, and serum lipids, avoiding cigarette or any type of tobacco, and the need for long term follow up was also discussed with patient.  - f/u in 1 year, sooner prn  3,4. Hypertensive retinopathy OU  - discussed importance of tight BP control  - monitor  5. Mixed form age related cataract OU  - The symptoms of cataract, surgical options, and treatments and risks were discussed with patient.  - discussed diagnosis and progression  - not yet visually significant  - monitor for now   Ophthalmic Meds Ordered this visit:  No orders of the defined types were placed in this encounter.      Return in about 1 year (around 03/17/2020) for f/u DM exam, DFE, OCT.  There are no Patient Instructions on file for this visit.   Explained the diagnoses, plan, and follow up with the patient and they expressed understanding.  Patient expressed understanding of the importance of proper follow up care.   This document serves as a record of services personally performed by Karie ChimeraBrian G. Fletcher Ostermiller, MD, PhD. It was created on their behalf by Laurian BrimAmanda Brown, OA, an ophthalmic assistant. The creation of this record is the provider's dictation and/or activities during the visit.    Electronically signed by: Laurian BrimAmanda Brown, OA  06.02.2020 12:03 PM    Karie ChimeraBrian G. Lataysha Vohra, M.D., Ph.D. Diseases & Surgery of the Retina and Vitreous [facility]  I have reviewed the above documentation for accuracy and completeness, and I agree with the above.  Karie Chimera, M.D., Ph.D. 03/18/19 12:03 PM    Abbreviations: M myopia (nearsighted); A astigmatism; H hyperopia (farsighted); P presbyopia; Mrx spectacle prescription;  CTL contact lenses; OD right eye; OS left  eye; OU both eyes  XT exotropia; ET esotropia; PEK punctate epithelial keratitis; PEE punctate epithelial erosions; DES dry eye syndrome; MGD meibomian gland dysfunction; ATs artificial tears; PFAT's preservative free artificial tears; NSC nuclear sclerotic cataract; PSC posterior subcapsular cataract; ERM epi-retinal membrane; PVD posterior vitreous detachment; RD retinal detachment; DM diabetes mellitus; DR diabetic retinopathy; NPDR non-proliferative diabetic retinopathy; PDR proliferative diabetic retinopathy; CSME clinically significant macular edema; DME diabetic macular edema; dbh dot blot hemorrhages; CWS cotton wool spot; POAG primary open angle glaucoma; C/D cup-to-disc ratio; HVF humphrey visual field; GVF goldmann visual field; OCT optical coherence tomography; IOP intraocular pressure; BRVO Branch retinal vein occlusion; CRVO central retinal vein occlusion; CRAO central retinal artery occlusion; BRAO branch retinal artery occlusion; RT retinal tear; SB scleral buckle; PPV pars plana vitrectomy; VH Vitreous hemorrhage; PRP panretinal laser photocoagulation; IVK intravitreal kenalog; VMT vitreomacular traction; MH Macular hole;  NVD neovascularization of the disc; NVE neovascularization elsewhere; AREDS age related eye disease study; ARMD age related macular degeneration; POAG primary open angle glaucoma; EBMD epithelial/anterior basement membrane dystrophy; ACIOL anterior chamber intraocular lens; IOL intraocular lens; PCIOL posterior chamber intraocular lens; Phaco/IOL phacoemulsification with intraocular lens placement; PRK photorefractive keratectomy; LASIK laser assisted in situ keratomileusis; HTN hypertension; DM diabetes mellitus; COPD chronic obstructive pulmonary disease

## 2019-03-16 NOTE — Progress Notes (Signed)
 .  Numeric Pain Rating Scale and Functional Assessment Average Pain 7   In the last MONTH (on 0-10 scale) has pain interfered with the following?  1. General activity like being  able to carry out your everyday physical activities such as walking, climbing stairs, carrying groceries, or moving a chair?  Rating(6)   -Dye Allergies.  

## 2019-03-16 NOTE — Progress Notes (Signed)
   [name] - 78 y.o. female MRN [id number]  Date of birth: 06/03/1941  Office Visit Note: Visit Date: 03/16/2019 PCP: Philip Aspen, Limmie Patricia, MD Referred by: Philip Aspen, Estel*  Subjective: Chief Complaint  Patient presents with  . Right Shoulder - Pain  . Right Upper Arm - Pain   HPI:  Jill Graham is a 78 y.o. female who comes in today At the request of Dr. Doneen Poisson for diagnostic hopefully therapeutic anesthetic arthrogram of the right glenohumeral joint with fluoroscopic guidance.  Prior subacromial injection did seem to help when she had it done earlier in the year.  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  She reports decreased range of motion and pain with movement of the shoulder.  No radicular complaints.  ROS Otherwise per HPI.  Assessment & Plan: Visit Diagnoses:  1. Chronic right shoulder pain     Plan: No additional findings.   Meds & Orders: No orders of the defined types were placed in this encounter.   Orders Placed This Encounter  Procedures  . Large Joint Inj: R glenohumeral  . XR C-ARM NO REPORT    Follow-up: No follow-ups on file.   Procedures: Large Joint Inj: R glenohumeral on 03/16/2019 8:25 AM Indications: pain and diagnostic evaluation Details: 22 G 3.5 in needle, anteromedial approach  Arthrogram: Yes  Medications: 4 mL bupivacaine 0.25 %; 80 mg triamcinolone acetonide 40 MG/ML  Arthrogram demonstrated excellent flow of contrast throughout the joint surface without extravasation or obvious defect.  The patient DID NOT have relief of symptoms during the anesthetic phase of the injection.  Procedure, treatment alternatives, risks and benefits explained, specific risks discussed. Consent was given by the patient. Immediately prior to procedure a time out was called to verify the correct patient, procedure, equipment, support staff and site/side marked as required. Patient was prepped and draped in the usual sterile fashion.      No notes on file   Clinical History: No specialty comments available.     Objective:  VS:  HT:    WT:   BMI:     BP:   HR: bpm  TEMP: ( )  RESP:  Physical Exam  Ortho Exam Imaging: Xr C-arm No Report  Result Date: 03/16/2019 Please see Notes tab for imaging impression.

## 2019-03-18 ENCOUNTER — Encounter (INDEPENDENT_AMBULATORY_CARE_PROVIDER_SITE_OTHER): Payer: Self-pay | Admitting: Ophthalmology

## 2019-03-18 ENCOUNTER — Other Ambulatory Visit: Payer: Self-pay

## 2019-03-18 ENCOUNTER — Ambulatory Visit (INDEPENDENT_AMBULATORY_CARE_PROVIDER_SITE_OTHER): Payer: Medicare Other | Admitting: Ophthalmology

## 2019-03-18 DIAGNOSIS — E119 Type 2 diabetes mellitus without complications: Secondary | ICD-10-CM | POA: Diagnosis not present

## 2019-03-18 DIAGNOSIS — I10 Essential (primary) hypertension: Secondary | ICD-10-CM | POA: Diagnosis not present

## 2019-03-18 DIAGNOSIS — H25813 Combined forms of age-related cataract, bilateral: Secondary | ICD-10-CM | POA: Diagnosis not present

## 2019-03-18 DIAGNOSIS — H3581 Retinal edema: Secondary | ICD-10-CM

## 2019-03-18 DIAGNOSIS — H35033 Hypertensive retinopathy, bilateral: Secondary | ICD-10-CM | POA: Diagnosis not present

## 2019-05-22 ENCOUNTER — Other Ambulatory Visit: Payer: Self-pay | Admitting: Internal Medicine

## 2019-07-17 ENCOUNTER — Ambulatory Visit (INDEPENDENT_AMBULATORY_CARE_PROVIDER_SITE_OTHER): Payer: Medicare Other

## 2019-07-17 ENCOUNTER — Other Ambulatory Visit: Payer: Self-pay

## 2019-07-17 DIAGNOSIS — Z23 Encounter for immunization: Secondary | ICD-10-CM | POA: Diagnosis not present

## 2019-08-04 ENCOUNTER — Other Ambulatory Visit: Payer: Self-pay | Admitting: Internal Medicine

## 2019-08-04 DIAGNOSIS — G4700 Insomnia, unspecified: Secondary | ICD-10-CM

## 2019-08-11 ENCOUNTER — Other Ambulatory Visit: Payer: Self-pay | Admitting: Internal Medicine

## 2019-08-11 DIAGNOSIS — F339 Major depressive disorder, recurrent, unspecified: Secondary | ICD-10-CM

## 2019-09-13 DIAGNOSIS — E785 Hyperlipidemia, unspecified: Secondary | ICD-10-CM | POA: Diagnosis not present

## 2019-09-13 DIAGNOSIS — R0789 Other chest pain: Secondary | ICD-10-CM | POA: Diagnosis not present

## 2019-09-13 DIAGNOSIS — E13311 Other specified diabetes mellitus with unspecified diabetic retinopathy with macular edema: Secondary | ICD-10-CM | POA: Diagnosis not present

## 2019-09-13 DIAGNOSIS — E039 Hypothyroidism, unspecified: Secondary | ICD-10-CM | POA: Diagnosis not present

## 2019-09-13 DIAGNOSIS — K219 Gastro-esophageal reflux disease without esophagitis: Secondary | ICD-10-CM | POA: Diagnosis not present

## 2019-09-13 DIAGNOSIS — E119 Type 2 diabetes mellitus without complications: Secondary | ICD-10-CM | POA: Diagnosis not present

## 2019-10-21 ENCOUNTER — Other Ambulatory Visit: Payer: Self-pay | Admitting: Internal Medicine

## 2019-10-21 DIAGNOSIS — F339 Major depressive disorder, recurrent, unspecified: Secondary | ICD-10-CM

## 2019-10-21 DIAGNOSIS — B0089 Other herpesviral infection: Secondary | ICD-10-CM

## 2020-03-17 ENCOUNTER — Encounter (INDEPENDENT_AMBULATORY_CARE_PROVIDER_SITE_OTHER): Payer: Medicare Other | Admitting: Ophthalmology

## 2021-07-18 ENCOUNTER — Telehealth: Payer: Self-pay

## 2021-07-18 ENCOUNTER — Other Ambulatory Visit: Payer: Self-pay | Admitting: Registered Nurse

## 2021-07-18 DIAGNOSIS — E2839 Other primary ovarian failure: Secondary | ICD-10-CM

## 2021-07-18 NOTE — Telephone Encounter (Signed)
Notes scanned to referral 

## 2022-07-23 ENCOUNTER — Other Ambulatory Visit: Payer: Self-pay | Admitting: Registered Nurse

## 2022-07-23 ENCOUNTER — Other Ambulatory Visit: Payer: Self-pay

## 2022-07-23 DIAGNOSIS — E2839 Other primary ovarian failure: Secondary | ICD-10-CM

## 2022-10-16 ENCOUNTER — Encounter: Payer: Self-pay | Admitting: Internal Medicine

## 2022-10-16 ENCOUNTER — Ambulatory Visit (INDEPENDENT_AMBULATORY_CARE_PROVIDER_SITE_OTHER): Payer: Medicare PPO | Admitting: Internal Medicine

## 2022-10-16 VITALS — BP 122/78 | HR 77 | Temp 98.4°F | Ht 65.0 in | Wt 222.8 lb

## 2022-10-16 DIAGNOSIS — E119 Type 2 diabetes mellitus without complications: Secondary | ICD-10-CM

## 2022-10-16 DIAGNOSIS — I10 Essential (primary) hypertension: Secondary | ICD-10-CM

## 2022-10-16 DIAGNOSIS — K219 Gastro-esophageal reflux disease without esophagitis: Secondary | ICD-10-CM | POA: Diagnosis not present

## 2022-10-16 DIAGNOSIS — M25511 Pain in right shoulder: Secondary | ICD-10-CM

## 2022-10-16 DIAGNOSIS — E038 Other specified hypothyroidism: Secondary | ICD-10-CM

## 2022-10-16 DIAGNOSIS — G8929 Other chronic pain: Secondary | ICD-10-CM

## 2022-10-16 DIAGNOSIS — F339 Major depressive disorder, recurrent, unspecified: Secondary | ICD-10-CM

## 2022-10-16 LAB — COMPREHENSIVE METABOLIC PANEL
ALT: 12 U/L (ref 0–35)
AST: 15 U/L (ref 0–37)
Albumin: 4.1 g/dL (ref 3.5–5.2)
Alkaline Phosphatase: 74 U/L (ref 39–117)
BUN: 24 mg/dL — ABNORMAL HIGH (ref 6–23)
CO2: 31 mEq/L (ref 19–32)
Calcium: 9.7 mg/dL (ref 8.4–10.5)
Chloride: 102 mEq/L (ref 96–112)
Creatinine, Ser: 1.06 mg/dL (ref 0.40–1.20)
GFR: 49.7 mL/min — ABNORMAL LOW (ref >60.00)
Glucose, Bld: 108 mg/dL — ABNORMAL HIGH (ref 70–99)
Potassium: 4.1 mEq/L (ref 3.5–5.1)
Sodium: 142 mEq/L (ref 135–145)
Total Bilirubin: 0.3 mg/dL (ref 0.2–1.2)
Total Protein: 6.8 g/dL (ref 6.0–8.3)

## 2022-10-16 LAB — LIPID PANEL
Cholesterol: 160 mg/dL (ref 0–200)
HDL: 64.4 mg/dL (ref >39.00)
LDL Cholesterol: 71 mg/dL (ref 0–99)
NonHDL: 95.72
Total CHOL/HDL Ratio: 2
Triglycerides: 123 mg/dL (ref 0.0–149.0)
VLDL: 24.6 mg/dL (ref 0.0–40.0)

## 2022-10-16 LAB — CBC WITH DIFFERENTIAL/PLATELET
Basophils Absolute: 0 10*3/uL (ref 0.0–0.1)
Basophils Relative: 0.7 % (ref 0.0–3.0)
Eosinophils Absolute: 0.2 10*3/uL (ref 0.0–0.7)
Eosinophils Relative: 3.3 % (ref 0.0–5.0)
HCT: 39.1 % (ref 36.0–46.0)
Hemoglobin: 12.8 g/dL (ref 12.0–15.0)
Lymphocytes Relative: 33.7 % (ref 12.0–46.0)
Lymphs Abs: 1.9 10*3/uL (ref 0.7–4.0)
MCHC: 32.8 g/dL (ref 30.0–36.0)
MCV: 94.2 fl (ref 78.0–100.0)
Monocytes Absolute: 0.5 10*3/uL (ref 0.1–1.0)
Monocytes Relative: 8.4 % (ref 3.0–12.0)
Neutro Abs: 3 10*3/uL (ref 1.4–7.7)
Neutrophils Relative %: 53.9 % (ref 43.0–77.0)
Platelets: 190 10*3/uL (ref 150.0–400.0)
RBC: 4.15 Mil/uL (ref 3.87–5.11)
RDW: 12.9 % (ref 11.5–15.5)
WBC: 5.6 10*3/uL (ref 4.0–10.5)

## 2022-10-16 LAB — POCT GLYCOSYLATED HEMOGLOBIN (HGB A1C): Hemoglobin A1C: 5.8 % — AB (ref 4.0–5.6)

## 2022-10-16 LAB — MICROALBUMIN / CREATININE URINE RATIO
Creatinine,U: 101 mg/dL
Microalb Creat Ratio: 0.7 mg/g (ref 0.0–30.0)
Microalb, Ur: <0.7 mg/dL (ref 0.0–1.9)

## 2022-10-16 LAB — TSH: TSH: 3.8 u[IU]/mL (ref 0.35–5.50)

## 2022-10-16 NOTE — Progress Notes (Signed)
New Patient Office Visit     CC/Reason for Visit: Establish care, chronic medical conditions Previous PCP: [facility], last seen by me in January 2020. Last Visit: Unknown  HPI: Jill Graham is a 82 y.o. female who is coming in today for the above mentioned reasons. Past Medical History is significant for: Hypertension, type 2 diabetes, depression, insomnia, hyperlipidemia hypothyroidism and GERD.  She had a flareup of her GERD recently and resumed omeprazole in the last week or so which has proved helpful.  She used to be my patient, I last saw her in January 2020.  She is moving her care back to me.  She tells me she has been diagnosed with right shoulder arthritis and is requesting referral to orthopedics to discuss possible intra-articular steroid injection.   Past Medical/Surgical History: Past Medical History:  Diagnosis Date   Arthritis    Arthritis    Back pain    Depression    Diabetes mellitus    GERD (gastroesophageal reflux disease)    Hypertension    Knee pain     Past Surgical History:  Procedure Laterality Date   CESAREAN SECTION  1975    Social History:  reports that she has quit smoking. She has never used smokeless tobacco. She reports current alcohol use. She reports that she does not use drugs.  Allergies: No Known Allergies  Family History:  Family History  Problem Relation Age of Onset   High blood pressure Mother    High Cholesterol Mother    Stroke Mother    Obesity Mother    Diabetes Father    Diabetes Maternal Aunt    Diabetes Maternal Uncle    Lung cancer Paternal Aunt      Current Outpatient Medications:    acyclovir (ZOVIRAX) 400 MG tablet, TAKE 1 TABLET BY MOUTH  TWICE DAILY, Disp: 180 tablet, Rfl: 0   aspirin EC 81 MG tablet, Take 81 mg by mouth daily., Disp: , Rfl:    bisoprolol-hydrochlorothiazide (ZIAC) 5-6.25 MG tablet, TAKE 1 TABLET BY MOUTH  DAILY, Disp: 90 tablet, Rfl: 0   cholecalciferol (VITAMIN D) 1000 units  tablet, Take 1,000 Units by mouth daily., Disp: , Rfl:    FLUoxetine (PROZAC) 20 MG capsule, TAKE 1 CAPSULE BY MOUTH  DAILY, Disp: 90 capsule, Rfl: 0   FLUoxetine (PROZAC) 40 MG capsule, TAKE 1 CAPSULE BY MOUTH  DAILY, Disp: 90 capsule, Rfl: 0   levothyroxine (SYNTHROID) 25 MCG tablet, TAKE 1 TABLET BY MOUTH  DAILY, Disp: 90 tablet, Rfl: 0   losartan (COZAAR) 100 MG tablet, TAKE 1 TABLET BY MOUTH  DAILY, Disp: 90 tablet, Rfl: 0   metFORMIN (GLUCOPHAGE) 500 MG tablet, Take by mouth 2 (two) times daily with a meal., Disp: , Rfl:    Multiple Vitamins-Minerals (MULTIVITAMIN WITH MINERALS) tablet, Take 1 tablet by mouth daily., Disp: , Rfl:    Omega-3 Fatty Acids (FISH OIL) 1000 MG CAPS, Take by mouth., Disp: , Rfl:    omeprazole (PRILOSEC) 40 MG capsule, Take 40 mg by mouth daily., Disp: , Rfl:    simvastatin (ZOCOR) 20 MG tablet, TAKE 1 TABLET BY MOUTH IN  THE EVENING, Disp: 90 tablet, Rfl: 0   traZODone (DESYREL) 50 MG tablet, TAKE 1 TABLET BY MOUTH AT  BEDTIME, Disp: 90 tablet, Rfl: 3   vitamin B-12 (CYANOCOBALAMIN) 1000 MCG tablet, Take 1,000 mcg by mouth daily., Disp: , Rfl:   Review of Systems:  Constitutional: Denies fever, chills, diaphoresis, appetite  change and fatigue.  HEENT: Denies photophobia, eye pain, redness, hearing loss, ear pain, congestion, sore throat, rhinorrhea, sneezing, mouth sores, trouble swallowing, neck pain, neck stiffness and tinnitus.   Respiratory: Denies SOB, DOE, cough, chest tightness,  and wheezing.   Cardiovascular: Denies chest pain, palpitations and leg swelling.  Gastrointestinal: Denies nausea, vomiting, abdominal pain, diarrhea, constipation, blood in stool and abdominal distention.  Genitourinary: Denies dysuria, urgency, frequency, hematuria, flank pain and difficulty urinating.  Endocrine: Denies: hot or cold intolerance, sweats, changes in hair or nails, polyuria, polydipsia. Musculoskeletal: Denies myalgias, back pain, joint swelling, arthralgias and  gait problem.  Skin: Denies pallor, rash and wound.  Neurological: Denies dizziness, seizures, syncope, weakness, light-headedness, numbness and headaches.  Hematological: Denies adenopathy. Easy bruising, personal or family bleeding history  Psychiatric/Behavioral: Denies suicidal ideation, mood changes, confusion, nervousness, sleep disturbance and agitation    Physical Exam: Vitals:   10/16/22 1006  BP: 122/78  Pulse: 77  Temp: 98.4 F (36.9 C)  TempSrc: Oral  SpO2: 97%  Weight: 222 lb 12.8 oz (101.1 kg)  Height: 5\' 5"  (1.651 m)   Body mass index is 37.08 kg/m.   Constitutional: NAD, calm, comfortable Eyes: PERRL, lids and conjunctivae normal, wears corrective lenses ENMT: Mucous membranes are moist. Respiratory: clear to auscultation bilaterally, no wheezing, no crackles. Normal respiratory effort. No accessory muscle use.  Cardiovascular: Regular rate and rhythm, no murmurs / rubs / gallops. No extremity edema.   Psychiatric: Normal judgment and insight. Alert and oriented x 3. Normal mood.     Impression and Plan:  Type 2 diabetes mellitus without complication, without long-term current use of insulin ([place]) - Plan: Urine microalbumin-creatinine with uACR, POCT glycosylated hemoglobin (Hb A1C), Lipid panel  Essential hypertension - Plan: CBC with Differential/Platelet, Comprehensive metabolic panel  Gastroesophageal reflux disease without esophagitis  Other specified hypothyroidism - Plan: TSH  Depression, recurrent (HCC)  Chronic right shoulder pain - Plan: Ambulatory referral to Orthopedic Surgery  -A1c of 5.8 demonstrates excellent diabetic management. -Labs to be ordered today, microalbumin ordered today. -I will refer her to orthopedic surgery in regards to her chronic right shoulder pain that has been diagnosed as osteoarthritis. -Blood pressure is well-controlled. -Check lipids. -GERD is improved now that she has restarted omeprazole.  Time spent: 46  minutes reviewing chart, interviewing and examining patient, formulating plan of care.  Patient Instructions  -Nice seeing you today!!  -Lab work today; will notify you once results are available.  -Schedule follow up in 3-4 months.    Lelon Frohlich, MD  Primary Care at [facility]

## 2022-10-16 NOTE — Patient Instructions (Signed)
-  Nice seeing you today!!  -Lab work today; will notify you once results are available.  -Schedule follow up in 3-4 months. 

## 2022-10-17 ENCOUNTER — Encounter: Payer: Self-pay | Admitting: Internal Medicine

## 2022-10-17 DIAGNOSIS — N183 Chronic kidney disease, stage 3 unspecified: Secondary | ICD-10-CM | POA: Insufficient documentation

## 2022-10-23 ENCOUNTER — Telehealth (INDEPENDENT_AMBULATORY_CARE_PROVIDER_SITE_OTHER): Payer: Medicare PPO | Admitting: Internal Medicine

## 2022-10-23 ENCOUNTER — Encounter: Payer: Self-pay | Admitting: Internal Medicine

## 2022-10-23 VITALS — Wt 222.0 lb

## 2022-10-23 DIAGNOSIS — N1831 Chronic kidney disease, stage 3a: Secondary | ICD-10-CM

## 2022-10-23 NOTE — Progress Notes (Signed)
Virtual Visit via Video Note  I connected with Jill Graham on 10/23/22 at  4:00 PM EST by a video enabled telemedicine application and verified that I am speaking with the correct person using two identifiers.  Location patient: home Location provider: work office Persons participating in the virtual visit: patient, provider  I discussed the limitations of evaluation and management by telemedicine and the availability of in person appointments. The patient expressed understanding and agreed to proceed.   HPI: After labs drawn earlier this week she was found to have CKD stage IIIa and she scheduled this visit to further discuss.   ROS: Constitutional: Denies fever, chills, diaphoresis, appetite change and fatigue.  HEENT: Denies photophobia, eye pain, redness, hearing loss, ear pain, congestion, sore throat, rhinorrhea, sneezing, mouth sores, trouble swallowing, neck pain, neck stiffness and tinnitus.   Respiratory: Denies SOB, DOE, cough, chest tightness,  and wheezing.   Cardiovascular: Denies chest pain, palpitations and leg swelling.  Gastrointestinal: Denies nausea, vomiting, abdominal pain, diarrhea, constipation, blood in stool and abdominal distention.  Genitourinary: Denies dysuria, urgency, frequency, hematuria, flank pain and difficulty urinating.  Endocrine: Denies: hot or cold intolerance, sweats, changes in hair or nails, polyuria, polydipsia. Musculoskeletal: Denies myalgias, back pain, joint swelling, arthralgias and gait problem.  Skin: Denies pallor, rash and wound.  Neurological: Denies dizziness, seizures, syncope, weakness, light-headedness, numbness and headaches.  Hematological: Denies adenopathy. Easy bruising, personal or family bleeding history  Psychiatric/Behavioral: Denies suicidal ideation, mood changes, confusion, nervousness, sleep disturbance and agitation   Past Medical History:  Diagnosis Date   Arthritis    Arthritis    Back pain     Depression    Diabetes mellitus    GERD (gastroesophageal reflux disease)    Hypertension    Knee pain     Past Surgical History:  Procedure Laterality Date   CESAREAN SECTION  1975    Family History  Problem Relation Age of Onset   High blood pressure Mother    High Cholesterol Mother    Stroke Mother    Obesity Mother    Diabetes Father    Diabetes Maternal Aunt    Diabetes Maternal Uncle    Lung cancer Paternal Aunt     SOCIAL HX:   reports that she has quit smoking. She has never used smokeless tobacco. She reports current alcohol use. She reports that she does not use drugs.   Current Outpatient Medications:    acyclovir (ZOVIRAX) 400 MG tablet, TAKE 1 TABLET BY MOUTH  TWICE DAILY, Disp: 180 tablet, Rfl: 0   aspirin EC 81 MG tablet, Take 81 mg by mouth daily., Disp: , Rfl:    bisoprolol-hydrochlorothiazide (ZIAC) 5-6.25 MG tablet, TAKE 1 TABLET BY MOUTH  DAILY, Disp: 90 tablet, Rfl: 0   cholecalciferol (VITAMIN D) 1000 units tablet, Take 1,000 Units by mouth daily., Disp: , Rfl:    FLUoxetine (PROZAC) 20 MG capsule, TAKE 1 CAPSULE BY MOUTH  DAILY, Disp: 90 capsule, Rfl: 0   FLUoxetine (PROZAC) 40 MG capsule, TAKE 1 CAPSULE BY MOUTH  DAILY, Disp: 90 capsule, Rfl: 0   levothyroxine (SYNTHROID) 25 MCG tablet, TAKE 1 TABLET BY MOUTH  DAILY, Disp: 90 tablet, Rfl: 0   losartan (COZAAR) 100 MG tablet, TAKE 1 TABLET BY MOUTH  DAILY, Disp: 90 tablet, Rfl: 0   metFORMIN (GLUCOPHAGE) 500 MG tablet, Take by mouth 2 (two) times daily with a meal., Disp: , Rfl:    Multiple Vitamins-Minerals (MULTIVITAMIN WITH MINERALS) tablet,  Take 1 tablet by mouth daily., Disp: , Rfl:    Omega-3 Fatty Acids (FISH OIL) 1000 MG CAPS, Take by mouth., Disp: , Rfl:    omeprazole (PRILOSEC) 40 MG capsule, Take 40 mg by mouth daily., Disp: , Rfl:    simvastatin (ZOCOR) 20 MG tablet, TAKE 1 TABLET BY MOUTH IN  THE EVENING, Disp: 90 tablet, Rfl: 0   traZODone (DESYREL) 50 MG tablet, TAKE 1 TABLET BY MOUTH  AT  BEDTIME, Disp: 90 tablet, Rfl: 3   vitamin B-12 (CYANOCOBALAMIN) 1000 MCG tablet, Take 1,000 mcg by mouth daily., Disp: , Rfl:   EXAM:   VITALS per patient if applicable: None reported  GENERAL: alert, oriented, appears well and in no acute distress  HEENT: atraumatic, conjunttiva clear, no obvious abnormalities on inspection of external nose and ears  NECK: normal movements of the head and neck  LUNGS: on inspection no signs of respiratory distress, breathing rate appears normal, no obvious gross increased work of breathing, gasping or wheezing  CV: no obvious cyanosis  MS: moves all visible extremities without noticeable abnormality  PSYCH/NEURO: pleasant and cooperative, no obvious depression or anxiety, speech and thought processing grossly intact  ASSESSMENT AND PLAN:   Stage 3a chronic kidney disease ([place])  -We discussed what stage IIIa chronic kidney disease means.  We discussed plan for observation at this time and risk factor management to prevent progression.   I discussed the assessment and treatment plan with the patient. The patient was provided an opportunity to ask questions and all were answered. The patient agreed with the plan and demonstrated an understanding of the instructions.   The patient was advised to call back or seek an in-person evaluation if the symptoms worsen or if the condition fails to improve as anticipated.    Lelon Frohlich, MD  [facility] at [facility]

## 2022-11-05 ENCOUNTER — Ambulatory Visit: Payer: Medicare PPO | Admitting: Orthopaedic Surgery

## 2022-11-15 ENCOUNTER — Encounter: Payer: Self-pay | Admitting: Orthopaedic Surgery

## 2022-11-15 ENCOUNTER — Ambulatory Visit: Payer: Self-pay

## 2022-11-15 ENCOUNTER — Ambulatory Visit: Payer: Medicare PPO | Admitting: Orthopaedic Surgery

## 2022-11-15 ENCOUNTER — Ambulatory Visit (INDEPENDENT_AMBULATORY_CARE_PROVIDER_SITE_OTHER): Payer: Medicare PPO | Admitting: Sports Medicine

## 2022-11-15 ENCOUNTER — Ambulatory Visit (INDEPENDENT_AMBULATORY_CARE_PROVIDER_SITE_OTHER): Payer: Medicare PPO

## 2022-11-15 DIAGNOSIS — M25511 Pain in right shoulder: Secondary | ICD-10-CM

## 2022-11-15 DIAGNOSIS — G8929 Other chronic pain: Secondary | ICD-10-CM

## 2022-11-15 DIAGNOSIS — M19011 Primary osteoarthritis, right shoulder: Secondary | ICD-10-CM

## 2022-11-15 MED ORDER — METHYLPREDNISOLONE ACETATE 40 MG/ML IJ SUSP
40.0000 mg | INTRAMUSCULAR | Status: AC | PRN
  Administered 2022-11-15: 40 mg via INTRA_ARTICULAR

## 2022-11-15 MED ORDER — BUPIVACAINE HCL 0.25 % IJ SOLN
2.0000 mL | INTRAMUSCULAR | Status: AC | PRN
  Administered 2022-11-15: 2 mL via INTRA_ARTICULAR

## 2022-11-15 MED ORDER — LIDOCAINE HCL 1 % IJ SOLN
2.0000 mL | INTRAMUSCULAR | Status: AC | PRN
  Administered 2022-11-15: 2 mL

## 2022-11-15 MED ORDER — CELECOXIB 100 MG PO CAPS: 100.0000 mg | ORAL_CAPSULE | Freq: Two times a day (BID) | ORAL | 2 refills | Status: DC | PRN

## 2022-11-15 NOTE — Progress Notes (Addendum)
   Procedure Note  Patient: Jill Graham             Date of Birth: 05/06/1941           MRN: [id number]             Visit Date: 11/15/2022  Procedures: Visit Diagnoses:  1. Primary osteoarthritis, right shoulder   2. Chronic right shoulder pain    Large Joint Inj: R glenohumeral on 11/15/2022 10:18 AM Indications: pain Details: 22 G 3.5 in needle, ultrasound-guided posterior approach Medications: 2 mL lidocaine 1 %; 2 mL bupivacaine 0.25 %; 40 mg methylPREDNISolone acetate 40 MG/ML Outcome: tolerated well, no immediate complications  US-guided glenohumeral joint injection, right shoulder After discussion on risks/benefits/indications, informed verbal consent was obtained. A timeout was then performed. The patient was positioned lying lateral recumbent on examination table. [name] shoulder was prepped with betadine and multiple alcohol swabs and utilizing ultrasound guidance, the patient's glenohumeral joint was identified on ultrasound. Using ultrasound guidance a 22-gauge, 3.5 inch needle with a mixture of 2:2:1 cc's lidocaine:bupivicaine:depomedrol was directed from a lateral to medial direction via in-plane technique into the glenohumeral joint with visualization of appropriate spread of injectate into the joint. Patient tolerated the procedure well without immediate complications.      Procedure, treatment alternatives, risks and benefits explained, specific risks discussed. Consent was given by the patient. Immediately prior to procedure a time out was called to verify the correct patient, procedure, equipment, support staff and site/side marked as required. Patient was prepped and draped in the usual sterile fashion.     - I evaluated the patient about 10 minutes post-injection and she had excellent improvement in pain (states she had no pain through ROM upon testing after injection) - follow-up with Dr. Erlinda Hong as indicated; I am happy to see them as needed  Elba Barman,  DO [place]  This note was dictated using Dragon naturally speaking software and may contain errors in syntax, spelling, or content which have not been identified prior to signing this note.

## 2022-11-15 NOTE — Progress Notes (Signed)
Office Visit Note   Patient: Jill Graham           Date of Birth: 30-Jan-1941           MRN: [id number] Visit Date: 11/15/2022              Requested by: Isaac Bliss, Rayford Halsted, MD [place],  [place] [address] PCP: Isaac Bliss, Rayford Halsted, MD   Assessment & Plan: Visit Diagnoses:  1. Chronic right shoulder pain     Plan: Impression is right shoulder glenohumeral osteoarthritis.  We have discussed various treatment options to include prescription medication versus cortisone injection versus shoulder arthroplasty.  She tells me that she does not have any support at home right now stuff currently recommended proceeding with glenohumeral injection.  If she does not have any relief with this, she will follow-up with Dr. Marlou Sa for surgical consultation.  Follow-Up Instructions: Return if symptoms worsen or fail to improve.   Orders:  Orders Placed This Encounter  Procedures   XR Shoulder Right   No orders of the defined types were placed in this encounter.     Procedures: No procedures performed   Clinical Data: No additional findings.   Subjective: Chief Complaint  Patient presents with   Right Shoulder - Pain    HPI patient is a very pleasant 82 year old female who comes in today with right shoulder pain for the past 1 to 2 years that has progressively worsened.  She denies any injury or change in activity.  The pain she has is to the entire shoulder which radiates to the top of her shoulder and down the arm.  She notes associated catching and grinding.  Symptoms are worse with certain movements of the shoulder to include forward flexion and internal rotation.  She has tried Tylenol and Celebrex.  She does note a remote cortisone injection about 4 years ago which did not help.  She is unsure whether this was subacromial or glenohumeral.  Review of Systems as detailed in HPI.  All others reviewed and are negative.   Objective: Vital Signs: There  were no vitals taken for this visit.  Physical Exam well-developed well-nourished female no acute distress.  Alert and oriented x 3.  Ortho Exam right shoulder exam is very limited: Approximately 50% range of motion in all planes.  4 out of 5 strength throughout.  She is neurovascular intact distally.  Specialty Comments:  No specialty comments available.  Imaging: XR Shoulder Right  Result Date: 11/15/2022 X-rays demonstrate advanced glenohumeral degenerative changes with peritracheal osteophytes.  Advanced AC joint degenerative changes.  No superior migration of the humeral head    PMFS History: Patient Active Problem List   Diagnosis Date Noted   CKD (chronic kidney disease) stage 3, GFR 30-59 ml/min (HCC) 10/17/2022   Insomnia 01/28/2019   Osteoarthritis of left knee 09/22/2018   Depression, recurrent ([place]) 09/15/2018   GERD (gastroesophageal reflux disease) 09/15/2018   Other fatigue 06/10/2018   Shortness of breath on exertion 06/10/2018   Essential hypertension 06/10/2018   Other specified hypothyroidism 06/10/2018   Type 2 diabetes mellitus without complication, without long-term current use of insulin ([place]) 06/10/2018   Past Medical History:  Diagnosis Date   Arthritis    Arthritis    Back pain    Depression    Diabetes mellitus    GERD (gastroesophageal reflux disease)    Hypertension    Knee pain     Family History  Problem  Relation Age of Onset   High blood pressure Mother    High Cholesterol Mother    Stroke Mother    Obesity Mother    Diabetes Father    Diabetes Maternal Aunt    Diabetes Maternal Uncle    Lung cancer Paternal Aunt     Past Surgical History:  Procedure Laterality Date   CESAREAN SECTION  1975   Social History   Occupational History   Occupation: Retired  Tobacco Use   Smoking status: Former   Smokeless tobacco: Never  Scientific laboratory technician Use: Never used  Substance and Sexual Activity   Alcohol use: Yes   Drug use: No    Sexual activity: Never

## 2022-12-09 ENCOUNTER — Telehealth: Payer: Self-pay | Admitting: Internal Medicine

## 2022-12-09 NOTE — Telephone Encounter (Signed)
Pt called to request a refill of the following:   levothyroxine (SYNTHROID) 25 MCG tablet   LOV:  10/16/22  Please advise.  [place], [place] Phone: [phone]  Fax: [phone]

## 2022-12-10 MED ORDER — LEVOTHYROXINE SODIUM 25 MCG PO TABS: 25.0000 ug | ORAL_TABLET | Freq: Every day | ORAL | 1 refills | Status: DC

## 2022-12-10 NOTE — Telephone Encounter (Signed)
Refill sent.

## 2022-12-17 ENCOUNTER — Telehealth: Payer: Self-pay | Admitting: Internal Medicine

## 2022-12-17 DIAGNOSIS — F339 Major depressive disorder, recurrent, unspecified: Secondary | ICD-10-CM

## 2022-12-17 MED ORDER — FLUOXETINE HCL 20 MG PO CAPS: 20.0000 mg | ORAL_CAPSULE | Freq: Every day | ORAL | 1 refills | Status: DC

## 2022-12-17 NOTE — Telephone Encounter (Signed)
Prescription Request  12/17/2022  LOV: 10/16/2022  What is the name of the medication or equipment?     FLUoxetine (PROZAC) 20 MG capsule  Have you contacted your pharmacy to request a refill? No   Which pharmacy would you like this sent to?  [facility], [place] [address] Phone: [phone] Fax: [phone]    Patient notified that their request is being sent to the clinical staff for review and that they should receive a response within 2 business days. Pt stated she need a refill sent to the pharmacy until her mail order come in  Please advise at Mobile [phone] (mobile)

## 2022-12-17 NOTE — Telephone Encounter (Signed)
Refill sent.

## 2022-12-19 NOTE — Telephone Encounter (Signed)
error 

## 2022-12-20 ENCOUNTER — Telehealth: Payer: Self-pay | Admitting: Internal Medicine

## 2022-12-20 DIAGNOSIS — B0089 Other herpesviral infection: Secondary | ICD-10-CM

## 2022-12-20 DIAGNOSIS — F339 Major depressive disorder, recurrent, unspecified: Secondary | ICD-10-CM

## 2022-12-20 DIAGNOSIS — G4700 Insomnia, unspecified: Secondary | ICD-10-CM

## 2022-12-20 NOTE — Telephone Encounter (Signed)
Prescription Request  12/20/2022  LOV: 10/16/2022   Pt called very annoyed and frustrated stating she has been requesting & waiting for over 2 months for MD to send authorization for her to start using [facility] mail delivery for her refills.  Pt informed that MD is OOO on Fridays.  Pt stated she needs refills of the following:  What is the name of the medication or equipment?  FLUoxetine (PROZAC) 20 MG capsule  simvastatin (ZOCOR) 20 MG tablet  bisoprolol-hydrochlorothiazide (ZIAC) 5-6.25 MG tablet  acyclovir (ZOVIRAX) 400 MG tablet  levothyroxine (SYNTHROID) 25 MCG tablet losartan (COZAAR) 100 MG tablet metFORMIN (GLUCOPHAGE) 500 MG tablet  traZODone (DESYREL) 50 MG tablet  levothyroxine (SYNTHROID) 25 MCG tablet  celecoxib (CELEBREX) 100 MG capsule    Have you contacted your pharmacy to request a refill? No   Which pharmacy would you like this sent to?   [place], [place] Phone: [phone]  Fax: [phone]       Patient notified that their request is being sent to the clinical staff for review and that they should receive a response within 2 business days.   Please advise at Mobile [phone] (mobile)

## 2022-12-23 MED ORDER — METFORMIN HCL 500 MG PO TABS: 500.0000 mg | ORAL_TABLET | Freq: Two times a day (BID) | ORAL | 1 refills | Status: DC

## 2022-12-23 MED ORDER — CELECOXIB 100 MG PO CAPS: 100.0000 mg | ORAL_CAPSULE | Freq: Two times a day (BID) | ORAL | 1 refills | Status: DC | PRN

## 2022-12-23 MED ORDER — SIMVASTATIN 20 MG PO TABS: 20.0000 mg | ORAL_TABLET | Freq: Every evening | ORAL | 1 refills | Status: DC

## 2022-12-23 MED ORDER — ACYCLOVIR 400 MG PO TABS: 400.0000 mg | ORAL_TABLET | Freq: Two times a day (BID) | ORAL | 0 refills | Status: DC

## 2022-12-23 MED ORDER — LEVOTHYROXINE SODIUM 25 MCG PO TABS: 25.0000 ug | ORAL_TABLET | Freq: Every day | ORAL | 1 refills | Status: DC

## 2022-12-23 MED ORDER — LOSARTAN POTASSIUM 100 MG PO TABS: 100.0000 mg | ORAL_TABLET | Freq: Every day | ORAL | 1 refills | Status: DC

## 2022-12-23 MED ORDER — BISOPROLOL-HYDROCHLOROTHIAZIDE 5-6.25 MG PO TABS: 1.0000 | ORAL_TABLET | Freq: Every day | ORAL | 1 refills | Status: DC

## 2022-12-23 MED ORDER — TRAZODONE HCL 50 MG PO TABS: 50.0000 mg | ORAL_TABLET | Freq: Every day | ORAL | 1 refills | Status: DC

## 2022-12-23 MED ORDER — FLUOXETINE HCL 20 MG PO CAPS: 20.0000 mg | ORAL_CAPSULE | Freq: Every day | ORAL | 1 refills | Status: DC

## 2022-12-23 NOTE — Telephone Encounter (Signed)
Okay to refill Celebrex and Trazodone?

## 2022-12-23 NOTE — Telephone Encounter (Signed)
Refills sent

## 2023-01-23 ENCOUNTER — Other Ambulatory Visit: Payer: Self-pay | Admitting: *Deleted

## 2023-01-23 MED ORDER — OMEPRAZOLE 40 MG PO CPDR: 40.0000 mg | DELAYED_RELEASE_CAPSULE | Freq: Every day | ORAL | 1 refills | Status: DC

## 2023-01-28 ENCOUNTER — Other Ambulatory Visit: Payer: Self-pay | Admitting: *Deleted

## 2023-01-28 DIAGNOSIS — G4700 Insomnia, unspecified: Secondary | ICD-10-CM

## 2023-01-28 MED ORDER — LOSARTAN POTASSIUM 100 MG PO TABS: 100.0000 mg | ORAL_TABLET | Freq: Every day | ORAL | 1 refills | Status: DC

## 2023-01-28 MED ORDER — SIMVASTATIN 20 MG PO TABS: 20.0000 mg | ORAL_TABLET | Freq: Every evening | ORAL | 1 refills | Status: DC

## 2023-01-28 MED ORDER — CELECOXIB 100 MG PO CAPS: 100.0000 mg | ORAL_CAPSULE | Freq: Two times a day (BID) | ORAL | 1 refills | Status: DC | PRN

## 2023-01-28 MED ORDER — TRAZODONE HCL 50 MG PO TABS: 50.0000 mg | ORAL_TABLET | Freq: Every day | ORAL | 1 refills | Status: DC

## 2023-01-28 MED ORDER — METFORMIN HCL 500 MG PO TABS: 500.0000 mg | ORAL_TABLET | Freq: Two times a day (BID) | ORAL | 1 refills | Status: DC

## 2023-01-28 MED ORDER — OMEPRAZOLE 40 MG PO CPDR: 40.0000 mg | DELAYED_RELEASE_CAPSULE | Freq: Every day | ORAL | 1 refills | Status: DC

## 2023-03-03 ENCOUNTER — Other Ambulatory Visit: Payer: Self-pay | Admitting: Internal Medicine

## 2023-03-03 DIAGNOSIS — B0089 Other herpesviral infection: Secondary | ICD-10-CM

## 2023-03-05 ENCOUNTER — Encounter: Payer: Self-pay | Admitting: Internal Medicine

## 2023-03-05 ENCOUNTER — Ambulatory Visit (INDEPENDENT_AMBULATORY_CARE_PROVIDER_SITE_OTHER): Payer: Medicare PPO | Admitting: Internal Medicine

## 2023-03-05 VITALS — BP 124/78 | HR 64 | Temp 97.8°F | Ht 64.5 in | Wt 216.2 lb

## 2023-03-05 DIAGNOSIS — E038 Other specified hypothyroidism: Secondary | ICD-10-CM

## 2023-03-05 DIAGNOSIS — I10 Essential (primary) hypertension: Secondary | ICD-10-CM

## 2023-03-05 DIAGNOSIS — N1831 Chronic kidney disease, stage 3a: Secondary | ICD-10-CM | POA: Diagnosis not present

## 2023-03-05 DIAGNOSIS — Z0000 Encounter for general adult medical examination without abnormal findings: Secondary | ICD-10-CM | POA: Diagnosis not present

## 2023-03-05 DIAGNOSIS — E119 Type 2 diabetes mellitus without complications: Secondary | ICD-10-CM | POA: Diagnosis not present

## 2023-03-05 DIAGNOSIS — Z7984 Long term (current) use of oral hypoglycemic drugs: Secondary | ICD-10-CM

## 2023-03-05 LAB — CBC WITH DIFFERENTIAL/PLATELET
Basophils Absolute: 0 10*3/uL (ref 0.0–0.1)
Basophils Relative: 0.8 % (ref 0.0–3.0)
Eosinophils Absolute: 0.2 10*3/uL (ref 0.0–0.7)
Eosinophils Relative: 4.2 % (ref 0.0–5.0)
HCT: 39.5 % (ref 36.0–46.0)
Hemoglobin: 12.7 g/dL (ref 12.0–15.0)
Lymphocytes Relative: 37.7 % (ref 12.0–46.0)
Lymphs Abs: 2 10*3/uL (ref 0.7–4.0)
MCHC: 32.1 g/dL (ref 30.0–36.0)
MCV: 95.7 fl (ref 78.0–100.0)
Monocytes Absolute: 0.5 10*3/uL (ref 0.1–1.0)
Monocytes Relative: 8.9 % (ref 3.0–12.0)
Neutro Abs: 2.5 10*3/uL (ref 1.4–7.7)
Neutrophils Relative %: 48.4 % (ref 43.0–77.0)
Platelets: 193 10*3/uL (ref 150.0–400.0)
RBC: 4.13 Mil/uL (ref 3.87–5.11)
RDW: 13.1 % (ref 11.5–15.5)
WBC: 5.3 10*3/uL (ref 4.0–10.5)

## 2023-03-05 LAB — COMPREHENSIVE METABOLIC PANEL
ALT: 12 U/L (ref 0–35)
AST: 17 U/L (ref 0–37)
Albumin: 3.9 g/dL (ref 3.5–5.2)
Alkaline Phosphatase: 74 U/L (ref 39–117)
BUN: 15 mg/dL (ref 6–23)
CO2: 30 mEq/L (ref 19–32)
Calcium: 9.6 mg/dL (ref 8.4–10.5)
Chloride: 104 mEq/L (ref 96–112)
Creatinine, Ser: 1.01 mg/dL (ref 0.40–1.20)
GFR: 52.53 mL/min — ABNORMAL LOW (ref >60.00)
Glucose, Bld: 120 mg/dL — ABNORMAL HIGH (ref 70–99)
Potassium: 4.2 mEq/L (ref 3.5–5.1)
Sodium: 141 mEq/L (ref 135–145)
Total Bilirubin: 0.4 mg/dL (ref 0.2–1.2)
Total Protein: 6.6 g/dL (ref 6.0–8.3)

## 2023-03-05 LAB — HEMOGLOBIN A1C: Hgb A1c MFr Bld: 5.9 % (ref 4.6–6.5)

## 2023-03-05 LAB — LIPID PANEL
Cholesterol: 150 mg/dL (ref 0–200)
HDL: 58.1 mg/dL (ref >39.00)
LDL Cholesterol: 72 mg/dL (ref 0–99)
NonHDL: 92.08
Total CHOL/HDL Ratio: 3
Triglycerides: 98 mg/dL (ref 0.0–149.0)
VLDL: 19.6 mg/dL (ref 0.0–40.0)

## 2023-03-05 LAB — TSH: TSH: 1.88 u[IU]/mL (ref 0.35–5.50)

## 2023-03-05 NOTE — Patient Instructions (Signed)
-  Nice seeing you today!!  -Lab work today; will notify you once results are available.  -Make sure you update shingles, Tdap and RSV vaccines.

## 2023-03-05 NOTE — Progress Notes (Signed)
Established Patient Office Visit     CC/Reason for Visit: Annual preventive exam and subsequent Medicare wellness visit  HPI: Jill Graham is a 82 y.o. female who is coming in today for the above mentioned reasons. Past Medical History is significant for: Hypertension, hyperlipidemia, type 2 diabetes, GERD, hypothyroidism, depression, insomnia.  She is feeling well.  She will be moving to [place] at the end of the month to live with her son.  She is due for Tdap, second shingles and RSV vaccines.  She has elected to defer all cancer screening due to age, I agree.   Past Medical/Surgical History: Past Medical History:  Diagnosis Date   Arthritis    Arthritis    Back pain    Depression    Diabetes mellitus    GERD (gastroesophageal reflux disease)    Hypertension    Knee pain     Past Surgical History:  Procedure Laterality Date   CESAREAN SECTION  1975    Social History:  reports that she has quit smoking. She has never used smokeless tobacco. She reports current alcohol use. She reports that she does not use drugs.  Allergies: No Known Allergies  Family History:  Family History  Problem Relation Age of Onset   High blood pressure Mother    High Cholesterol Mother    Stroke Mother    Obesity Mother    Diabetes Father    Diabetes Maternal Aunt    Diabetes Maternal Uncle    Lung cancer Paternal Aunt      Current Outpatient Medications:    acyclovir (ZOVIRAX) 400 MG tablet, TAKE 1 TABLET TWICE DAILY, Disp: 180 tablet, Rfl: 1   aspirin EC 81 MG tablet, Take 81 mg by mouth daily., Disp: , Rfl:    bisoprolol-hydrochlorothiazide (ZIAC) 5-6.25 MG tablet, Take 1 tablet by mouth daily., Disp: 90 tablet, Rfl: 1   celecoxib (CELEBREX) 100 MG capsule, Take 1 capsule (100 mg total) by mouth 2 (two) times daily as needed., Disp: 180 capsule, Rfl: 1   cholecalciferol (VITAMIN D) 1000 units tablet, Take 1,000 Units by mouth daily., Disp: , Rfl:    FLUoxetine (PROZAC) 40  MG capsule, TAKE 1 CAPSULE BY MOUTH  DAILY, Disp: 90 capsule, Rfl: 0   levothyroxine (SYNTHROID) 25 MCG tablet, Take 1 tablet (25 mcg total) by mouth daily., Disp: 90 tablet, Rfl: 1   losartan (COZAAR) 100 MG tablet, Take 1 tablet (100 mg total) by mouth daily., Disp: 90 tablet, Rfl: 1   metFORMIN (GLUCOPHAGE) 500 MG tablet, Take 1 tablet (500 mg total) by mouth 2 (two) times daily with a meal., Disp: 180 tablet, Rfl: 1   Multiple Vitamins-Minerals (MULTIVITAMIN WITH MINERALS) tablet, Take 1 tablet by mouth daily., Disp: , Rfl:    Omega-3 Fatty Acids (FISH OIL) 1000 MG CAPS, Take by mouth., Disp: , Rfl:    omeprazole (PRILOSEC) 40 MG capsule, Take 1 capsule (40 mg total) by mouth daily., Disp: 90 capsule, Rfl: 1   simvastatin (ZOCOR) 20 MG tablet, Take 1 tablet (20 mg total) by mouth every evening., Disp: 90 tablet, Rfl: 1   traZODone (DESYREL) 50 MG tablet, Take 1 tablet (50 mg total) by mouth at bedtime., Disp: 90 tablet, Rfl: 1   vitamin B-12 (CYANOCOBALAMIN) 1000 MCG tablet, Take 1,000 mcg by mouth daily., Disp: , Rfl:   Review of Systems:  Negative unless indicated in HPI.   Physical Exam: Vitals:   03/05/23 1010  BP: 124/78  Pulse:  64  Temp: 97.8 F (36.6 C)  TempSrc: Oral  SpO2: 96%  Weight: 216 lb 3.2 oz (98.1 kg)  Height: 5' 4.5" (1.638 m)    Body mass index is 36.54 kg/m.   Physical Exam Vitals reviewed.  Constitutional:      General: She is not in acute distress.    Appearance: Normal appearance. She is not ill-appearing, toxic-appearing or diaphoretic.  HENT:     Head: Normocephalic.     Right Ear: Tympanic membrane, ear canal and external ear normal. There is no impacted cerumen.     Left Ear: Tympanic membrane, ear canal and external ear normal. There is no impacted cerumen.     Nose: Nose normal.     Mouth/Throat:     Mouth: Mucous membranes are moist.     Pharynx: Oropharynx is clear. No oropharyngeal exudate or posterior oropharyngeal erythema.  Eyes:      General: No scleral icterus.       Right eye: No discharge.        Left eye: No discharge.     Conjunctiva/sclera: Conjunctivae normal.     Pupils: Pupils are equal, round, and reactive to light.  Neck:     Vascular: No carotid bruit.  Cardiovascular:     Rate and Rhythm: Normal rate and regular rhythm.     Pulses: Normal pulses.     Heart sounds: Normal heart sounds.  Pulmonary:     Effort: Pulmonary effort is normal. No respiratory distress.     Breath sounds: Normal breath sounds.  Abdominal:     General: Abdomen is flat. Bowel sounds are normal.     Palpations: Abdomen is soft.  Musculoskeletal:        General: Normal range of motion.     Cervical back: Normal range of motion.  Skin:    General: Skin is warm and dry.  Neurological:     General: No focal deficit present.     Mental Status: She is alert and oriented to person, place, and time. Mental status is at baseline.  Psychiatric:        Mood and Affect: Mood normal.        Behavior: Behavior normal.        Thought Content: Thought content normal.        Judgment: Judgment normal.      Subsequent Medicare wellness visit   1. Risk factors, based on past  M,S,F - Cardiac Risk Factors include: diabetes mellitus;advanced age (>86men, >73 women)   2.  Physical activities: Dietary issues and exercise activities discussed:  Current Exercise Habits: The patient does not participate in regular exercise at present   3.  Depression/mood:  Flowsheet Row Video Visit from 10/23/2022 in [facility] Total Score 0        4.  ADL's:    03/05/2023   10:03 AM  In your present state of health, do you have any difficulty performing the following activities:  Hearing? 0  Vision? 1  Difficulty concentrating or making decisions? 1  Walking or climbing stairs? 1  Comment weaker  Dressing or bathing? 0  Doing errands, shopping? 0  Preparing Food and eating ? N  Using the Toilet? N  In  the past six months, have you accidently leaked urine? N  Do you have problems with loss of bowel control? N  Managing your Medications? N  Managing your Finances? N  Housekeeping or managing your Housekeeping? N  5.  Fall risk:     10/29/2018   10:01 AM 10/23/2022    3:07 PM 03/05/2023   10:05 AM  Fall Risk  Falls in the past year? 0 0 0  Was there an injury with Fall?  0 0  Fall Risk Category Calculator  0 0  Fall Risk Category (Retired)  Low   (RETIRED) Patient Fall Risk Level  Low fall risk   Patient at Risk for Falls Due to  No Fall Risks   Fall risk Follow up  Falls evaluation completed Falls evaluation completed     6.  Home safety: No problems identified   7.  Height weight, and visual acuity: height and weight as above, vision/hearing: See above.   8.  Counseling: -Discussed healthy lifestyle, including increased physical activity and better food choices to promote weight loss.     9. Lab orders based on risk factors: Laboratory update will be reviewed   10. Cognitive assessment:        03/05/2023   10:06 AM  6CIT Screen  What Year? 0 points  What month? 0 points  What time? 0 points  Count back from 20 0 points  Months in reverse 0 points  Repeat phrase 0 points  Total Score 0 points     11. Screening: Patient provided with a written and personalized 5-10 year screening schedule in the AVS. Health Maintenance  Topic Date Due   DTaP/Tdap/Td vaccine (2 - Td or Tdap) 10/15/2019   COVID-19 Vaccine (4 - 2023-24 season) 10/07/2022   Zoster (Shingles) Vaccine (2 of 2) 02/11/2023   Eye exam for diabetics  03/05/2023*   Hemoglobin A1C  04/16/2023   Flu Shot  05/15/2023   Yearly kidney function blood test for diabetes  10/17/2023   Yearly kidney health urinalysis for diabetes  10/17/2023   Complete foot exam   03/04/2024   Medicare Annual Wellness Visit  03/04/2024   Pneumonia Vaccine  Completed   DEXA scan (bone density measurement)  Completed   HPV  Vaccine  Aged Out  *Topic was postponed. The date shown is not the original due date.    12. Provider List Update: Patient Care Team    Relationship Specialty Notifications Start End  Philip Aspen, Limmie Patricia, MD PCP - General Internal Medicine  09/15/18      13. Advance Directives: Does Patient Have a Medical Advance Directive?: No Would patient like information on creating a medical advance directive?: No - Patient declined  14. Opioids:Patient is not on any opioid prescriptions and has no risk factors for a substance use disorder.    15.   Goals   None      I have personally reviewed and noted the following in the patient's chart:   Medical and social history Use of alcohol, tobacco or illicit drugs  Current medications and supplements Functional ability and status Nutritional status Physical activity Advanced directives List of other physicians Hospitalizations, surgeries, and ER visits in previous 12 months Vitals Screenings to include cognitive, depression, and falls Referrals and appointments  In addition, I have reviewed and discussed with patient certain preventive protocols, quality metrics, and best practice recommendations. A written personalized care plan for preventive services as well as general preventive health recommendations were provided to patient.   Impression and Plan:  Type 2 diabetes mellitus without complication, without long-term current use of insulin (HCC) -     CBC with Differential/Platelet; Future -     Comprehensive metabolic panel;  Future -     Lipid panel; Future -     Hemoglobin A1c; Future  Stage 3a chronic kidney disease (HCC)  Essential hypertension  Other specified hypothyroidism -     TSH; Future  Medicare annual wellness visit, subsequent   -Recommend routine eye and dental care. -Healthy lifestyle discussed in detail. -Labs to be updated today. -Prostate cancer screening: N/A Health Maintenance  Topic Date  Due   DTaP/Tdap/Td vaccine (2 - Td or Tdap) 10/15/2019   COVID-19 Vaccine (4 - 2023-24 season) 10/07/2022   Zoster (Shingles) Vaccine (2 of 2) 02/11/2023   Eye exam for diabetics  03/05/2023*   Hemoglobin A1C  04/16/2023   Flu Shot  05/15/2023   Yearly kidney function blood test for diabetes  10/17/2023   Yearly kidney health urinalysis for diabetes  10/17/2023   Complete foot exam   03/04/2024   Medicare Annual Wellness Visit  03/04/2024   Pneumonia Vaccine  Completed   DEXA scan (bone density measurement)  Completed   HPV Vaccine  Aged Out  *Topic was postponed. The date shown is not the original due date.     -Advised to update Tdap, shingles, RSV vaccine. -She has elected to discontinue all cancer screening due to age.     Chaya Jan, MD [facility]

## 2023-06-03 ENCOUNTER — Other Ambulatory Visit: Payer: Self-pay | Admitting: *Deleted

## 2023-06-03 DIAGNOSIS — F339 Major depressive disorder, recurrent, unspecified: Secondary | ICD-10-CM

## 2023-06-03 MED ORDER — FLUOXETINE HCL 40 MG PO CAPS
40.0000 mg | ORAL_CAPSULE | Freq: Every day | ORAL | 0 refills | Status: DC
Start: 2023-06-03 — End: 2023-06-19

## 2023-06-10 ENCOUNTER — Other Ambulatory Visit: Payer: Self-pay | Admitting: *Deleted

## 2023-06-10 MED ORDER — BISOPROLOL-HYDROCHLOROTHIAZIDE 5-6.25 MG PO TABS: 1.0000 | ORAL_TABLET | Freq: Every day | ORAL | 1 refills | Status: DC

## 2023-06-19 ENCOUNTER — Other Ambulatory Visit: Payer: Self-pay | Admitting: *Deleted

## 2023-06-19 DIAGNOSIS — B0089 Other herpesviral infection: Secondary | ICD-10-CM

## 2023-06-19 DIAGNOSIS — F339 Major depressive disorder, recurrent, unspecified: Secondary | ICD-10-CM

## 2023-06-19 MED ORDER — ACYCLOVIR 400 MG PO TABS: 400.0000 mg | ORAL_TABLET | Freq: Two times a day (BID) | ORAL | 1 refills | Status: AC

## 2023-06-19 MED ORDER — LEVOTHYROXINE SODIUM 25 MCG PO TABS: 25.0000 ug | ORAL_TABLET | Freq: Every day | ORAL | 1 refills | Status: AC

## 2023-06-19 MED ORDER — OMEPRAZOLE 40 MG PO CPDR: 40.0000 mg | DELAYED_RELEASE_CAPSULE | Freq: Every day | ORAL | 1 refills | Status: DC

## 2023-06-19 MED ORDER — LOSARTAN POTASSIUM 100 MG PO TABS: 100.0000 mg | ORAL_TABLET | Freq: Every day | ORAL | 1 refills | Status: AC

## 2023-06-19 MED ORDER — FLUOXETINE HCL 40 MG PO CAPS
40.0000 mg | ORAL_CAPSULE | Freq: Every day | ORAL | 1 refills | Status: DC
Start: 2023-06-19 — End: 2023-08-04

## 2023-06-19 MED ORDER — BISOPROLOL-HYDROCHLOROTHIAZIDE 5-6.25 MG PO TABS: 1.0000 | ORAL_TABLET | Freq: Every day | ORAL | 1 refills | Status: DC

## 2023-06-19 MED ORDER — SIMVASTATIN 20 MG PO TABS: 20.0000 mg | ORAL_TABLET | Freq: Every evening | ORAL | 1 refills | Status: AC

## 2023-06-19 MED ORDER — METFORMIN HCL 500 MG PO TABS: 500.0000 mg | ORAL_TABLET | Freq: Two times a day (BID) | ORAL | 1 refills | Status: AC

## 2023-06-24 ENCOUNTER — Other Ambulatory Visit: Payer: Self-pay | Admitting: *Deleted

## 2023-06-24 DIAGNOSIS — G4700 Insomnia, unspecified: Secondary | ICD-10-CM

## 2023-06-24 MED ORDER — TRAZODONE HCL 50 MG PO TABS
50.0000 mg | ORAL_TABLET | Freq: Every day | ORAL | 1 refills | Status: DC
Start: 2023-06-24 — End: 2024-01-22

## 2023-06-24 MED ORDER — ACCU-CHEK SOFTCLIX LANCETS MISC: 1.0000 | Freq: Every day | 12 refills | Status: AC

## 2023-06-26 ENCOUNTER — Other Ambulatory Visit: Payer: Self-pay | Admitting: *Deleted

## 2023-06-26 MED ORDER — ACCU-CHEK GUIDE W/DEVICE KIT: 1.0000 | PACK | Freq: Every day | 2 refills | Status: AC

## 2023-06-26 MED ORDER — ACCU-CHEK GUIDE VI STRP: 1.0000 | ORAL_STRIP | Freq: Every day | 12 refills | Status: AC

## 2023-06-26 MED ORDER — CELECOXIB 100 MG PO CAPS: 100.0000 mg | ORAL_CAPSULE | Freq: Two times a day (BID) | ORAL | 1 refills | Status: AC | PRN

## 2023-08-04 ENCOUNTER — Other Ambulatory Visit: Payer: Self-pay | Admitting: Internal Medicine

## 2023-08-04 DIAGNOSIS — F339 Major depressive disorder, recurrent, unspecified: Secondary | ICD-10-CM

## 2023-10-04 ENCOUNTER — Other Ambulatory Visit: Payer: Self-pay | Admitting: Internal Medicine

## 2024-01-01 ENCOUNTER — Other Ambulatory Visit: Payer: Self-pay | Admitting: Internal Medicine

## 2024-01-01 DIAGNOSIS — F339 Major depressive disorder, recurrent, unspecified: Secondary | ICD-10-CM

## 2024-01-11 ENCOUNTER — Other Ambulatory Visit: Payer: Self-pay | Admitting: Internal Medicine

## 2024-01-22 ENCOUNTER — Other Ambulatory Visit: Payer: Self-pay | Admitting: Internal Medicine

## 2024-01-22 ENCOUNTER — Telehealth: Payer: Self-pay | Admitting: Internal Medicine

## 2024-01-22 DIAGNOSIS — G4700 Insomnia, unspecified: Secondary | ICD-10-CM

## 2024-01-22 MED ORDER — TRAZODONE HCL 50 MG PO TABS
50.0000 mg | ORAL_TABLET | Freq: Every day | ORAL | 0 refills | Status: AC
Start: 2024-01-22 — End: ?

## 2024-01-22 MED ORDER — OMEPRAZOLE 40 MG PO CPDR: 40.0000 mg | DELAYED_RELEASE_CAPSULE | Freq: Every day | ORAL | 0 refills | Status: AC

## 2024-01-22 NOTE — Telephone Encounter (Signed)
 Spoke with patient to schedule her AWV.  She stated she has moved to [place]

## 2024-01-22 NOTE — Telephone Encounter (Signed)
 Copied from CRM [id number]. Topic: Clinical - Medication Refill >> Jan 22, 2024 11:45 AM Elizebeth Brooking wrote: Most Recent Primary Care Visit:  Provider: Henderson Cloud  Department: [facility]  Visit Type: MEDICARE AWV, SEQUENTIAL  Date: 03/05/2023  Medication: traZODone (DESYREL) 50 MG tablet omeprazole (PRILOSEC) 40 MG capsule  Has the patient contacted their pharmacy? Yes (Agent: If no, request that the patient contact the pharmacy for the refill. If patient does not wish to contact the pharmacy document the reason why and proceed with request.) (Agent: If yes, when and what did the pharmacy advise?)  Is this the correct pharmacy for this prescription? Yes If no, delete pharmacy and type the correct one.  This is the patient's preferred pharmacy:   [facility] - [place], [place] - [address] [place] [address] Phone: [phone] Fax: [phone]   Has the prescription been filled recently? No  Is the patient out of the medication? Yes  Has the patient been seen for an appointment in the last year OR does the patient have an upcoming appointment? Yes  Can we respond through MyChart? Yes  Agent: Please be advised that Rx refills may take up to 3 business days. We ask that you follow-up with your pharmacy.

## 2024-01-22 NOTE — Telephone Encounter (Signed)
 Patient has moved to [place]

## 2024-03-04 ENCOUNTER — Other Ambulatory Visit: Payer: Self-pay | Admitting: Internal Medicine

## 2024-03-04 DIAGNOSIS — F339 Major depressive disorder, recurrent, unspecified: Secondary | ICD-10-CM

## 2024-03-17 ENCOUNTER — Other Ambulatory Visit: Payer: Self-pay | Admitting: Internal Medicine

## 2024-03-17 DIAGNOSIS — G4700 Insomnia, unspecified: Secondary | ICD-10-CM

## 2024-03-21 ENCOUNTER — Other Ambulatory Visit: Payer: Self-pay | Admitting: Internal Medicine

## 2024-04-04 ENCOUNTER — Other Ambulatory Visit: Payer: Self-pay | Admitting: Internal Medicine

## 2024-09-25 ENCOUNTER — Other Ambulatory Visit: Payer: Self-pay | Admitting: Internal Medicine
